# Patient Record
Sex: Female | Born: 1960 | Race: Black or African American | Hispanic: No | Smoking: Current every day smoker
Health system: Southern US, Community
[De-identification: ages and names within clinical notes are randomized; demographics above are authoritative.]

## PROBLEM LIST (undated history)

## (undated) DIAGNOSIS — E785 Hyperlipidemia, unspecified: Secondary | ICD-10-CM

## (undated) DIAGNOSIS — F419 Anxiety disorder, unspecified: Secondary | ICD-10-CM

## (undated) DIAGNOSIS — R252 Cramp and spasm: Secondary | ICD-10-CM

## (undated) DIAGNOSIS — Z720 Tobacco use: Secondary | ICD-10-CM

## (undated) DIAGNOSIS — R0789 Other chest pain: Secondary | ICD-10-CM

## (undated) DIAGNOSIS — N200 Calculus of kidney: Secondary | ICD-10-CM

## (undated) DIAGNOSIS — I10 Essential (primary) hypertension: Secondary | ICD-10-CM

## (undated) HISTORY — DX: Calculus of kidney: N20.0

## (undated) HISTORY — DX: Tobacco use: Z72.0

## (undated) HISTORY — PX: SPINAL FUSION: SHX223

## (undated) HISTORY — PX: VAGINAL HYSTERECTOMY: SUR661

## (undated) HISTORY — DX: Other chest pain: R07.89

## (undated) HISTORY — DX: Hyperlipidemia, unspecified: E78.5

## (undated) HISTORY — PX: BREAST SURGERY: SHX581

## (undated) HISTORY — DX: Essential (primary) hypertension: I10

## (undated) HISTORY — DX: Anxiety disorder, unspecified: F41.9

## (undated) HISTORY — DX: Cramp and spasm: R25.2

## (undated) HISTORY — PX: FOOT SURGERY: SHX648

## (undated) HISTORY — PX: PELVIC LAPAROSCOPY: SHX162

## (undated) HISTORY — PX: BACK SURGERY: SHX140

---

## 1998-09-24 ENCOUNTER — Other Ambulatory Visit: Admission: RE | Admit: 1998-09-24 | Discharge: 1998-09-24 | Payer: Self-pay | Admitting: Gynecology

## 1999-09-29 ENCOUNTER — Other Ambulatory Visit: Admission: RE | Admit: 1999-09-29 | Discharge: 1999-09-29 | Payer: Self-pay | Admitting: Gynecology

## 1999-10-22 ENCOUNTER — Encounter: Payer: Self-pay | Admitting: Family Medicine

## 1999-10-22 ENCOUNTER — Encounter: Admission: RE | Admit: 1999-10-22 | Discharge: 1999-10-22 | Payer: Self-pay | Admitting: Family Medicine

## 1999-10-28 ENCOUNTER — Encounter: Admission: RE | Admit: 1999-10-28 | Discharge: 1999-10-28 | Payer: Self-pay | Admitting: Family Medicine

## 1999-10-28 ENCOUNTER — Encounter: Payer: Self-pay | Admitting: Family Medicine

## 2000-02-15 ENCOUNTER — Encounter: Admission: RE | Admit: 2000-02-15 | Discharge: 2000-02-15 | Payer: Self-pay | Admitting: Family Medicine

## 2000-02-15 ENCOUNTER — Encounter: Payer: Self-pay | Admitting: Family Medicine

## 2000-10-11 ENCOUNTER — Other Ambulatory Visit: Admission: RE | Admit: 2000-10-11 | Discharge: 2000-10-11 | Payer: Self-pay | Admitting: Gynecology

## 2000-10-21 ENCOUNTER — Emergency Department (HOSPITAL_COMMUNITY): Admission: EM | Admit: 2000-10-21 | Discharge: 2000-10-21 | Payer: Self-pay | Admitting: Emergency Medicine

## 2000-10-22 ENCOUNTER — Emergency Department (HOSPITAL_COMMUNITY): Admission: EM | Admit: 2000-10-22 | Discharge: 2000-10-23 | Payer: Self-pay | Admitting: Emergency Medicine

## 2001-03-22 ENCOUNTER — Other Ambulatory Visit: Admission: RE | Admit: 2001-03-22 | Discharge: 2001-03-22 | Payer: Self-pay | Admitting: Gynecology

## 2001-11-01 ENCOUNTER — Other Ambulatory Visit: Admission: RE | Admit: 2001-11-01 | Discharge: 2001-11-01 | Payer: Self-pay | Admitting: Gynecology

## 2002-04-22 ENCOUNTER — Other Ambulatory Visit: Admission: RE | Admit: 2002-04-22 | Discharge: 2002-04-22 | Payer: Self-pay | Admitting: Gynecology

## 2002-07-03 ENCOUNTER — Encounter: Payer: Self-pay | Admitting: Gynecology

## 2002-07-03 ENCOUNTER — Encounter: Admission: RE | Admit: 2002-07-03 | Discharge: 2002-07-03 | Payer: Self-pay | Admitting: Gynecology

## 2002-11-11 ENCOUNTER — Other Ambulatory Visit: Admission: RE | Admit: 2002-11-11 | Discharge: 2002-11-11 | Payer: Self-pay | Admitting: Gynecology

## 2003-01-24 ENCOUNTER — Encounter: Admission: RE | Admit: 2003-01-24 | Discharge: 2003-01-24 | Payer: Self-pay | Admitting: *Deleted

## 2008-07-01 ENCOUNTER — Other Ambulatory Visit: Admission: RE | Admit: 2008-07-01 | Discharge: 2008-07-01 | Payer: Self-pay | Admitting: Gynecology

## 2008-09-22 ENCOUNTER — Ambulatory Visit (HOSPITAL_COMMUNITY): Admission: RE | Admit: 2008-09-22 | Discharge: 2008-09-22 | Payer: Self-pay | Admitting: Gynecology

## 2013-04-01 ENCOUNTER — Other Ambulatory Visit (HOSPITAL_COMMUNITY): Payer: Self-pay | Admitting: Internal Medicine

## 2013-04-01 ENCOUNTER — Ambulatory Visit (HOSPITAL_COMMUNITY)
Admission: RE | Admit: 2013-04-01 | Discharge: 2013-04-01 | Disposition: A | Discharge status: Home / Self Care | Payer: BC Managed Care – PPO | Source: Ambulatory Visit | Attending: Internal Medicine | Admitting: Internal Medicine

## 2013-04-01 DIAGNOSIS — J4489 Other specified chronic obstructive pulmonary disease: Secondary | ICD-10-CM | POA: Insufficient documentation

## 2013-04-01 DIAGNOSIS — R079 Chest pain, unspecified: Secondary | ICD-10-CM | POA: Insufficient documentation

## 2013-04-01 DIAGNOSIS — J449 Chronic obstructive pulmonary disease, unspecified: Secondary | ICD-10-CM | POA: Insufficient documentation

## 2013-04-01 DIAGNOSIS — R52 Pain, unspecified: Secondary | ICD-10-CM

## 2013-04-01 DIAGNOSIS — R002 Palpitations: Secondary | ICD-10-CM | POA: Insufficient documentation

## 2013-04-05 ENCOUNTER — Other Ambulatory Visit (HOSPITAL_COMMUNITY): Payer: Self-pay | Admitting: Internal Medicine

## 2013-04-05 DIAGNOSIS — Z1231 Encounter for screening mammogram for malignant neoplasm of breast: Secondary | ICD-10-CM

## 2013-04-17 ENCOUNTER — Ambulatory Visit (HOSPITAL_COMMUNITY)
Admission: RE | Admit: 2013-04-17 | Discharge: 2013-04-17 | Disposition: A | Discharge status: Home / Self Care | Payer: BC Managed Care – PPO | Source: Ambulatory Visit | Attending: Internal Medicine | Admitting: Internal Medicine

## 2013-04-17 DIAGNOSIS — Z1231 Encounter for screening mammogram for malignant neoplasm of breast: Secondary | ICD-10-CM | POA: Insufficient documentation

## 2013-04-18 ENCOUNTER — Other Ambulatory Visit: Payer: Self-pay | Admitting: Internal Medicine

## 2013-04-18 DIAGNOSIS — R928 Other abnormal and inconclusive findings on diagnostic imaging of breast: Secondary | ICD-10-CM

## 2013-05-02 ENCOUNTER — Ambulatory Visit
Admission: RE | Admit: 2013-05-02 | Discharge: 2013-05-02 | Disposition: A | Discharge status: Home / Self Care | Payer: BC Managed Care – PPO | Source: Ambulatory Visit | Attending: Internal Medicine | Admitting: Internal Medicine

## 2013-05-02 ENCOUNTER — Other Ambulatory Visit: Payer: Self-pay | Admitting: Internal Medicine

## 2013-05-02 DIAGNOSIS — R928 Other abnormal and inconclusive findings on diagnostic imaging of breast: Secondary | ICD-10-CM

## 2013-06-20 ENCOUNTER — Ambulatory Visit (INDEPENDENT_AMBULATORY_CARE_PROVIDER_SITE_OTHER): Payer: BC Managed Care – PPO | Admitting: Gynecology

## 2013-06-20 ENCOUNTER — Other Ambulatory Visit (HOSPITAL_COMMUNITY)
Admission: RE | Admit: 2013-06-20 | Discharge: 2013-06-20 | Disposition: A | Discharge status: Home / Self Care | Payer: BC Managed Care – PPO | Source: Ambulatory Visit | Attending: Gynecology | Admitting: Gynecology

## 2013-06-20 ENCOUNTER — Encounter: Payer: Self-pay | Admitting: Gynecology

## 2013-06-20 VITALS — BP 134/84 | Ht 68.0 in | Wt 158.0 lb

## 2013-06-20 DIAGNOSIS — N951 Menopausal and female climacteric states: Secondary | ICD-10-CM

## 2013-06-20 DIAGNOSIS — Z01419 Encounter for gynecological examination (general) (routine) without abnormal findings: Secondary | ICD-10-CM

## 2013-06-20 NOTE — Progress Notes (Signed)
Donna Simpson Apr 19, 1961 [id number]        52 y.o.  G2P2002 for annual exam. Has not been seen for a number of years. Doing well from a gynecologic standpoint.  Past medical history,surgical history, medications, allergies, family history and social history were all reviewed and documented in the EPIC chart.  ROS:  Performed and pertinent positives and negatives are included in the history, assessment and plan .  Exam: Kim assistant Filed Vitals:   06/20/13 1132  BP: 134/84  Height: 5\' 8"  (1.727 m)  Weight: 158 lb (71.668 kg)   General appearance  Normal Skin grossly normal Head/Neck normal with no cervical or supraclavicular adenopathy thyroid normal Lungs  clear Cardiac RR, without RMG Abdominal  soft, nontender, without masses, organomegaly or hernia Breasts  examined lying and sitting without masses, retractions, discharge or axillary adenopathy. Pelvic  Ext/BUS/vagina  normal   Adnexa  Without masses or tenderness    Anus and perineum  normal   Rectovaginal  normal sphincter tone without palpated masses or tenderness.    Assessment/Plan:  52 y.o. [id number] female for annual exam.   1. Status post vaginal hysterectomy. Having some hot flashes coming and going. We'll check baseline TSH FSH. Not to the extent that she wants to consider treatment such as ERT. Patient wants to monitor at present. 2. Pap smear 2009. Pap of cuff done today. I have no records for prior studies and her outpatient records have been destroyed. No patient provided history of abnormal Pap smears. 3. Mammography 04/2013. Had cyst aspirated at that time. We'll continue with annual mammography. SBE monthly reviewed. 4. Colonoscopy never. Recommended arranging screening colonoscopy and she agrees to do so. 5. Stop smoking strategies reviewed. 6. Health maintenance. Patient reports being followed for hypertension off of medication. Recent check at home 120/80. Blood pressure 134/84 here. She'll continue to  monitor and follow up with her primary physician if needed. No other blood work done as she reports having all done through her primary physician's office. Followup in one year, sooner as needed.  Note: This document was prepared with digital dictation and possible smart phrase technology. Any transcriptional errors that result from this process are unintentional.   Dara Lords MD, 12:03 PM 06/20/2013

## 2013-06-20 NOTE — Patient Instructions (Signed)
Schedule colonoscopy with [facility] gastroenterology at [phone] or [facility] gastroenterology at [phone]

## 2013-06-21 LAB — URINALYSIS W MICROSCOPIC + REFLEX CULTURE
Bacteria, UA: NONE SEEN
Bilirubin Urine: NEGATIVE
Casts: NONE SEEN
Crystals: NONE SEEN
Glucose, UA: NEGATIVE mg/dL
Hgb urine dipstick: NEGATIVE
Ketones, ur: NEGATIVE mg/dL
Leukocytes, UA: NEGATIVE
Nitrite: NEGATIVE
Protein, ur: 30 mg/dL — AB
Specific Gravity, Urine: 1.011 (ref 1.005–1.030)
Squamous Epithelial / LPF: NONE SEEN
Urobilinogen, UA: 0.2 mg/dL (ref 0.0–1.0)
pH: 5.5 (ref 5.0–8.0)

## 2013-06-21 LAB — FOLLICLE STIMULATING HORMONE: FSH: 17.3 m[IU]/mL

## 2013-06-21 LAB — TSH: TSH: 0.737 u[IU]/mL (ref 0.350–4.500)

## 2013-09-05 ENCOUNTER — Other Ambulatory Visit: Payer: Self-pay | Admitting: Orthopaedic Surgery

## 2013-09-05 DIAGNOSIS — M25561 Pain in right knee: Secondary | ICD-10-CM

## 2013-09-05 DIAGNOSIS — M545 Low back pain, unspecified: Secondary | ICD-10-CM

## 2013-09-12 ENCOUNTER — Other Ambulatory Visit: Payer: BC Managed Care – PPO

## 2014-05-27 ENCOUNTER — Other Ambulatory Visit: Payer: Self-pay

## 2014-05-27 DIAGNOSIS — Z1231 Encounter for screening mammogram for malignant neoplasm of breast: Secondary | ICD-10-CM

## 2014-06-03 ENCOUNTER — Ambulatory Visit
Admission: RE | Admit: 2014-06-03 | Discharge: 2014-06-03 | Disposition: A | Discharge status: Home / Self Care | Payer: BC Managed Care – PPO | Source: Ambulatory Visit

## 2014-06-03 ENCOUNTER — Encounter (INDEPENDENT_AMBULATORY_CARE_PROVIDER_SITE_OTHER): Payer: Self-pay

## 2014-06-03 DIAGNOSIS — Z1231 Encounter for screening mammogram for malignant neoplasm of breast: Secondary | ICD-10-CM

## 2014-09-15 ENCOUNTER — Encounter: Payer: Self-pay | Admitting: Gynecology

## 2014-09-25 ENCOUNTER — Encounter: Payer: BC Managed Care – PPO | Admitting: Gynecology

## 2014-10-31 ENCOUNTER — Ambulatory Visit (INDEPENDENT_AMBULATORY_CARE_PROVIDER_SITE_OTHER): Payer: BC Managed Care – PPO | Admitting: Gynecology

## 2014-10-31 ENCOUNTER — Encounter: Payer: Self-pay | Admitting: Gynecology

## 2014-10-31 VITALS — BP 124/76 | Ht 69.0 in | Wt 167.0 lb

## 2014-10-31 DIAGNOSIS — N951 Menopausal and female climacteric states: Secondary | ICD-10-CM

## 2014-10-31 DIAGNOSIS — Z01419 Encounter for gynecological examination (general) (routine) without abnormal findings: Secondary | ICD-10-CM

## 2014-10-31 LAB — CBC WITH DIFFERENTIAL/PLATELET
Basophils Absolute: 0.1 10*3/uL (ref 0.0–0.1)
Basophils Relative: 1 % (ref 0–1)
Eosinophils Absolute: 0.1 10*3/uL (ref 0.0–0.7)
Eosinophils Relative: 2 % (ref 0–5)
HCT: 36 % (ref 36.0–46.0)
Hemoglobin: 11.9 g/dL — ABNORMAL LOW (ref 12.0–15.0)
Lymphocytes Relative: 49 % — ABNORMAL HIGH (ref 12–46)
Lymphs Abs: 3.1 10*3/uL (ref 0.7–4.0)
MCH: 27.3 pg (ref 26.0–34.0)
MCHC: 33.1 g/dL (ref 30.0–36.0)
MCV: 82.6 fL (ref 78.0–100.0)
MPV: 9.7 fL (ref 9.4–12.4)
Monocytes Absolute: 0.3 10*3/uL (ref 0.1–1.0)
Monocytes Relative: 5 % (ref 3–12)
Neutro Abs: 2.7 10*3/uL (ref 1.7–7.7)
Neutrophils Relative %: 43 % (ref 43–77)
Platelets: 276 10*3/uL (ref 150–400)
RBC: 4.36 MIL/uL (ref 3.87–5.11)
RDW: 12.6 % (ref 11.5–15.5)
WBC: 6.3 10*3/uL (ref 4.0–10.5)

## 2014-10-31 NOTE — Progress Notes (Signed)
Donna Simpson [birth year] [id number]        53 y.o.  G2P2002 for annual exam.  Several issues noted below.  Past medical history,surgical history, problem list, medications, allergies, family history and social history were all reviewed and documented as reviewed in the EPIC chart.  ROS:  Performed with pertinent positives and negatives included in the history, assessment and plan.   Additional significant findings :  none   Exam: [facility] personassistant Filed Vitals:   10/31/14 1610  BP: 124/76  Height: 5\' 9"  (1.753 m)  Weight: 167 lb (75.751 kg)   General appearance:  Normal affect, orientation and appearance. Skin: Grossly normal HEENT: Without gross lesions.  No cervical or supraclavicular adenopathy. Thyroid normal.  Lungs:  Clear without wheezing, rales or rhonchi Cardiac: RR, without RMG Abdominal:  Soft, nontender, without masses, guarding, rebound, organomegaly or hernia Breasts:  Examined lying and sitting without masses, retractions, discharge or axillary adenopathy. Pelvic:  Ext/BUS/vagina normal  Adnexa  Without masses or tenderness    Anus and perineum  Normal   Rectovaginal  Normal sphincter tone without palpated masses or tenderness.    Assessment/Plan:  53 y.o. [id number] female for annual exam.   1. Status post TVH. Is having hot flashes and night sweats. Has tried OTC products to include Estrovin with good relief. Check baseline TSH FSH. Discussed possible ERT. Risks benefits to include the WHI study with increased risk of stroke heart attack DVT breast cancer reviewed. At this point patient is not interested in trying but wants to use the OTC product. Will call me if they worsen and was to consider ERT. 2. Pap smear 2014. No Pap smear done today. No history of significant abnormal Pap smears. 3. Mammography 05/2014. Continue with annual mammography. SBE monthly reviewed. 4. Colonoscopy 2015. Repeat at their recommended interval. 5. Health maintenance. Screening baseline CBC  comprehensive metabolic panel lipid profile urinalysis TSH vitamin D FSH ordered. Follow up for lab results otherwise annually. Sooner if menopausal symptoms worsen.     Dara LordsFONTAINE,[name] P MD, 4:38 PM 10/31/2014

## 2014-10-31 NOTE — Patient Instructions (Signed)
You may obtain a copy of any labs that were done today by logging onto MyChart as outlined in the instructions provided with your AVS (after visit summary). The office will not call with normal lab results but certainly if there are any significant abnormalities then we will contact you.   Health Maintenance, Female A healthy lifestyle and preventative care can promote health and wellness.  Maintain regular health, dental, and eye exams.  Eat a healthy diet. Foods like vegetables, fruits, whole grains, low-fat dairy products, and lean protein foods contain the nutrients you need without too many calories. Decrease your intake of foods high in solid fats, added sugars, and salt. Get information about a proper diet from your caregiver, if necessary.  Regular physical exercise is one of the most important things you can do for your health. Most adults should get at least 150 minutes of moderate-intensity exercise (any activity that increases your heart rate and causes you to sweat) each week. In addition, most adults need muscle-strengthening exercises on 2 or more days a week.   Maintain a healthy weight. The body mass index (BMI) is a screening tool to identify possible weight problems. It provides an estimate of body fat based on height and weight. Your caregiver can help determine your BMI, and can help you achieve or maintain a healthy weight. For adults 20 years and older:  A BMI below 18.5 is considered underweight.  A BMI of 18.5 to 24.9 is normal.  A BMI of 25 to 29.9 is considered overweight.  A BMI of 30 and above is considered obese.  Maintain normal blood lipids and cholesterol by exercising and minimizing your intake of saturated fat. Eat a balanced diet with plenty of fruits and vegetables. Blood tests for lipids and cholesterol should begin at age 61 and be repeated every 5 years. If your lipid or cholesterol levels are high, you are over 50, or you are a high risk for heart  disease, you may need your cholesterol levels checked more frequently.Ongoing high lipid and cholesterol levels should be treated with medicines if diet and exercise are not effective.  If you smoke, find out from your caregiver how to quit. If you do not use tobacco, do not start.  Lung cancer screening is recommended for adults aged 33 80 years who are at high risk for developing lung cancer because of a history of smoking. Yearly low-dose computed tomography (CT) is recommended for people who have at least a 30-pack-year history of smoking and are a current smoker or have quit within the past 15 years. A pack year of smoking is smoking an average of 1 pack of cigarettes a day for 1 year (for example: 1 pack a day for 30 years or 2 packs a day for 15 years). Yearly screening should continue until the smoker has stopped smoking for at least 15 years. Yearly screening should also be stopped for people who develop a health problem that would prevent them from having lung cancer treatment.  If you are pregnant, do not drink alcohol. If you are breastfeeding, be very cautious about drinking alcohol. If you are not pregnant and choose to drink alcohol, do not exceed 1 drink per day. One drink is considered to be 12 ounces (355 mL) of beer, 5 ounces (148 mL) of wine, or 1.5 ounces (44 mL) of liquor.  Avoid use of street drugs. Do not share needles with anyone. Ask for help if you need support or instructions about stopping  the use of drugs.  High blood pressure causes heart disease and increases the risk of stroke. Blood pressure should be checked at least every 1 to 2 years. Ongoing high blood pressure should be treated with medicines, if weight loss and exercise are not effective.  If you are 59 to 52 years old, ask your caregiver if you should take aspirin to prevent strokes.  Diabetes screening involves taking a blood sample to check your fasting blood sugar level. This should be done once every 3  years, after age 91, if you are within normal weight and without risk factors for diabetes. Testing should be considered at a younger age or be carried out more frequently if you are overweight and have at least 1 risk factor for diabetes.  Breast cancer screening is essential preventative care for women. You should practice "breast self-awareness." This means understanding the normal appearance and feel of your breasts and may include breast self-examination. Any changes detected, no matter how small, should be reported to a caregiver. Women in their 66s and 30s should have a clinical breast exam (CBE) by a caregiver as part of a regular health exam every 1 to 3 years. After age 101, women should have a CBE every year. Starting at age 100, women should consider having a mammogram (breast X-ray) every year. Women who have a family history of breast cancer should talk to their caregiver about genetic screening. Women at a high risk of breast cancer should talk to their caregiver about having an MRI and a mammogram every year.  Breast cancer gene (BRCA)-related cancer risk assessment is recommended for women who have family members with BRCA-related cancers. BRCA-related cancers include breast, ovarian, tubal, and peritoneal cancers. Having family members with these cancers may be associated with an increased risk for harmful changes (mutations) in the breast cancer genes BRCA1 and BRCA2. Results of the assessment will determine the need for genetic counseling and BRCA1 and BRCA2 testing.  The Pap test is a screening test for cervical cancer. Women should have a Pap test starting at age 57. Between ages 25 and 35, Pap tests should be repeated every 2 years. Beginning at age 37, you should have a Pap test every 3 years as long as the past 3 Pap tests have been normal. If you had a hysterectomy for a problem that was not cancer or a condition that could lead to cancer, then you no longer need Pap tests. If you are  between ages 50 and 76, and you have had normal Pap tests going back 10 years, you no longer need Pap tests. If you have had past treatment for cervical cancer or a condition that could lead to cancer, you need Pap tests and screening for cancer for at least 20 years after your treatment. If Pap tests have been discontinued, risk factors (such as a new sexual partner) need to be reassessed to determine if screening should be resumed. Some women have medical problems that increase the chance of getting cervical cancer. In these cases, your caregiver may recommend more frequent screening and Pap tests.  The human papillomavirus (HPV) test is an additional test that may be used for cervical cancer screening. The HPV test looks for the virus that can cause the cell changes on the cervix. The cells collected during the Pap test can be tested for HPV. The HPV test could be used to screen women aged 44 years and older, and should be used in women of any age  who have unclear Pap test results. After the age of 55, women should have HPV testing at the same frequency as a Pap test.  Colorectal cancer can be detected and often prevented. Most routine colorectal cancer screening begins at the age of 44 and continues through age 20. However, your caregiver may recommend screening at an earlier age if you have risk factors for colon cancer. On a yearly basis, your caregiver may provide home test kits to check for hidden blood in the stool. Use of a small camera at the end of a tube, to directly examine the colon (sigmoidoscopy or colonoscopy), can detect the earliest forms of colorectal cancer. Talk to your caregiver about this at age 86, when routine screening begins. Direct examination of the colon should be repeated every 5 to 10 years through age 13, unless early forms of pre-cancerous polyps or small growths are found.  Hepatitis C blood testing is recommended for all people born from [birth year] through 1964 and any  individual with known risks for hepatitis C.  Practice safe sex. Use condoms and avoid high-risk sexual practices to reduce the spread of sexually transmitted infections (STIs). Sexually active women aged 36 and younger should be checked for Chlamydia, which is a common sexually transmitted infection. Older women with new or multiple partners should also be tested for Chlamydia. Testing for other STIs is recommended if you are sexually active and at increased risk.  Osteoporosis is a disease in which the bones lose minerals and strength with aging. This can result in serious bone fractures. The risk of osteoporosis can be identified using a bone density scan. Women ages 20 and over and women at risk for fractures or osteoporosis should discuss screening with their caregivers. Ask your caregiver whether you should be taking a calcium supplement or vitamin D to reduce the rate of osteoporosis.  Menopause can be associated with physical symptoms and risks. Hormone replacement therapy is available to decrease symptoms and risks. You should talk to your caregiver about whether hormone replacement therapy is right for you.  Use sunscreen. Apply sunscreen liberally and repeatedly throughout the day. You should seek shade when your shadow is shorter than you. Protect yourself by wearing long sleeves, pants, a wide-brimmed hat, and sunglasses year round, whenever you are outdoors.  Notify your caregiver of new moles or changes in moles, especially if there is a change in shape or color. Also notify your caregiver if a mole is larger than the size of a pencil eraser.  Stay current with your immunizations. Document Released: 05/16/2011 Document Revised: 02/25/2013 Document Reviewed: 05/16/2011 [facility] Patient Information 2014 [employer].

## 2014-11-01 LAB — LIPID PANEL
Cholesterol: 159 mg/dL (ref 0–200)
HDL: 54 mg/dL (ref >39)
LDL Cholesterol: 90 mg/dL (ref 0–99)
Total CHOL/HDL Ratio: 2.9 Ratio
Triglycerides: 77 mg/dL (ref <150)
VLDL: 15 mg/dL (ref 0–40)

## 2014-11-01 LAB — COMPREHENSIVE METABOLIC PANEL
ALT: 12 U/L (ref 0–35)
AST: 17 U/L (ref 0–37)
Albumin: 4.1 g/dL (ref 3.5–5.2)
Alkaline Phosphatase: 59 U/L (ref 39–117)
BUN: 15 mg/dL (ref 6–23)
CO2: 26 mEq/L (ref 19–32)
Calcium: 9.6 mg/dL (ref 8.4–10.5)
Chloride: 99 mEq/L (ref 96–112)
Creat: 0.85 mg/dL (ref 0.50–1.10)
Glucose, Bld: 82 mg/dL (ref 70–99)
Potassium: 3.4 mEq/L — ABNORMAL LOW (ref 3.5–5.3)
Sodium: 137 mEq/L (ref 135–145)
Total Bilirubin: 0.4 mg/dL (ref 0.2–1.2)
Total Protein: 6.8 g/dL (ref 6.0–8.3)

## 2014-11-01 LAB — URINALYSIS W MICROSCOPIC + REFLEX CULTURE
Bacteria, UA: NONE SEEN
Bilirubin Urine: NEGATIVE
Casts: NONE SEEN
Crystals: NONE SEEN
Glucose, UA: NEGATIVE mg/dL
Hgb urine dipstick: NEGATIVE
Ketones, ur: NEGATIVE mg/dL
Leukocytes, UA: NEGATIVE
Nitrite: NEGATIVE
Protein, ur: 30 mg/dL — AB
Specific Gravity, Urine: 1.025 (ref 1.005–1.030)
Urobilinogen, UA: 0.2 mg/dL (ref 0.0–1.0)
pH: 5 (ref 5.0–8.0)

## 2014-11-01 LAB — VITAMIN D 25 HYDROXY (VIT D DEFICIENCY, FRACTURES): Vit D, 25-Hydroxy: 20 ng/mL — ABNORMAL LOW (ref 30–100)

## 2014-11-01 LAB — FOLLICLE STIMULATING HORMONE: FSH: 40.1 m[IU]/mL

## 2014-11-01 LAB — TSH: TSH: 1.285 u[IU]/mL (ref 0.350–4.500)

## 2014-11-04 ENCOUNTER — Other Ambulatory Visit: Payer: Self-pay | Admitting: Gynecology

## 2014-11-04 DIAGNOSIS — E876 Hypokalemia: Secondary | ICD-10-CM

## 2014-11-04 DIAGNOSIS — E559 Vitamin D deficiency, unspecified: Secondary | ICD-10-CM

## 2015-01-26 DIAGNOSIS — Z0289 Encounter for other administrative examinations: Secondary | ICD-10-CM

## 2015-03-05 ENCOUNTER — Other Ambulatory Visit: Payer: 59

## 2015-03-05 DIAGNOSIS — E559 Vitamin D deficiency, unspecified: Secondary | ICD-10-CM

## 2015-03-05 DIAGNOSIS — E876 Hypokalemia: Secondary | ICD-10-CM

## 2015-03-06 LAB — POTASSIUM: Potassium: 4.1 mEq/L (ref 3.5–5.3)

## 2015-03-08 LAB — VITAMIN D 1,25 DIHYDROXY
Vitamin D 1, 25 (OH)2 Total: 53 pg/mL (ref 18–72)
Vitamin D2 1, 25 (OH)2: <8 pg/mL
Vitamin D3 1, 25 (OH)2: 53 pg/mL

## 2015-05-19 ENCOUNTER — Other Ambulatory Visit: Payer: Self-pay

## 2015-05-19 DIAGNOSIS — Z1231 Encounter for screening mammogram for malignant neoplasm of breast: Secondary | ICD-10-CM

## 2015-06-09 ENCOUNTER — Ambulatory Visit
Admission: RE | Admit: 2015-06-09 | Discharge: 2015-06-09 | Disposition: A | Discharge status: Home / Self Care | Payer: 59 | Source: Ambulatory Visit

## 2015-06-09 DIAGNOSIS — Z1231 Encounter for screening mammogram for malignant neoplasm of breast: Secondary | ICD-10-CM

## 2015-08-21 ENCOUNTER — Emergency Department (HOSPITAL_COMMUNITY)
Admission: EM | Admit: 2015-08-21 | Discharge: 2015-08-21 | Disposition: A | Discharge status: Home / Self Care | Payer: 59 | Attending: Emergency Medicine | Admitting: Emergency Medicine

## 2015-08-21 ENCOUNTER — Emergency Department (HOSPITAL_COMMUNITY): Payer: 59

## 2015-08-21 ENCOUNTER — Encounter (HOSPITAL_COMMUNITY): Payer: Self-pay | Admitting: Emergency Medicine

## 2015-08-21 DIAGNOSIS — Z720 Tobacco use: Secondary | ICD-10-CM | POA: Diagnosis not present

## 2015-08-21 DIAGNOSIS — S3992XA Unspecified injury of lower back, initial encounter: Secondary | ICD-10-CM | POA: Insufficient documentation

## 2015-08-21 DIAGNOSIS — Y9389 Activity, other specified: Secondary | ICD-10-CM | POA: Insufficient documentation

## 2015-08-21 DIAGNOSIS — T148XXA Other injury of unspecified body region, initial encounter: Secondary | ICD-10-CM

## 2015-08-21 DIAGNOSIS — S199XXA Unspecified injury of neck, initial encounter: Secondary | ICD-10-CM | POA: Diagnosis not present

## 2015-08-21 DIAGNOSIS — F419 Anxiety disorder, unspecified: Secondary | ICD-10-CM | POA: Diagnosis not present

## 2015-08-21 DIAGNOSIS — Y998 Other external cause status: Secondary | ICD-10-CM | POA: Insufficient documentation

## 2015-08-21 DIAGNOSIS — Z91040 Latex allergy status: Secondary | ICD-10-CM | POA: Insufficient documentation

## 2015-08-21 DIAGNOSIS — S0990XA Unspecified injury of head, initial encounter: Secondary | ICD-10-CM | POA: Diagnosis present

## 2015-08-21 DIAGNOSIS — T148 Other injury of unspecified body region: Secondary | ICD-10-CM | POA: Insufficient documentation

## 2015-08-21 DIAGNOSIS — Y92410 Unspecified street and highway as the place of occurrence of the external cause: Secondary | ICD-10-CM | POA: Diagnosis not present

## 2015-08-21 DIAGNOSIS — Z79899 Other long term (current) drug therapy: Secondary | ICD-10-CM | POA: Insufficient documentation

## 2015-08-21 MED ORDER — CYCLOBENZAPRINE HCL 10 MG PO TABS: 10.0000 mg | ORAL_TABLET | Freq: Two times a day (BID) | ORAL | Status: DC | PRN

## 2015-08-21 MED ORDER — IBUPROFEN 800 MG PO TABS: 800.0000 mg | ORAL_TABLET | Freq: Three times a day (TID) | ORAL | Status: DC
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
## 2015-12-03 ENCOUNTER — Ambulatory Visit (INDEPENDENT_AMBULATORY_CARE_PROVIDER_SITE_OTHER): Payer: Medicaid Other | Admitting: Gynecology

## 2015-12-03 ENCOUNTER — Encounter: Payer: Self-pay | Admitting: Gynecology

## 2015-12-03 VITALS — BP 118/76 | Ht 69.0 in | Wt 176.0 lb

## 2015-12-03 DIAGNOSIS — Z1329 Encounter for screening for other suspected endocrine disorder: Secondary | ICD-10-CM

## 2015-12-03 DIAGNOSIS — Z13220 Encounter for screening for lipoid disorders: Secondary | ICD-10-CM | POA: Diagnosis not present

## 2015-12-03 DIAGNOSIS — Z0000 Encounter for general adult medical examination without abnormal findings: Secondary | ICD-10-CM

## 2015-12-03 DIAGNOSIS — Z1321 Encounter for screening for nutritional disorder: Secondary | ICD-10-CM

## 2015-12-03 DIAGNOSIS — Z01419 Encounter for gynecological examination (general) (routine) without abnormal findings: Secondary | ICD-10-CM | POA: Diagnosis not present

## 2015-12-03 LAB — LIPID PANEL
Cholesterol: 159 mg/dL (ref 125–200)
HDL: 63 mg/dL (ref >=46)
LDL Cholesterol: 75 mg/dL (ref <130)
Total CHOL/HDL Ratio: 2.5 Ratio (ref <=5.0)
Triglycerides: 104 mg/dL (ref <150)
VLDL: 21 mg/dL (ref <30)

## 2015-12-03 LAB — CBC WITH DIFFERENTIAL/PLATELET
Basophils Absolute: 0 10*3/uL (ref 0.0–0.1)
Basophils Relative: 0 % (ref 0–1)
Eosinophils Absolute: 0.1 10*3/uL (ref 0.0–0.7)
Eosinophils Relative: 2 % (ref 0–5)
HCT: 38.4 % (ref 36.0–46.0)
Hemoglobin: 12.1 g/dL (ref 12.0–15.0)
Lymphocytes Relative: 47 % — ABNORMAL HIGH (ref 12–46)
Lymphs Abs: 3.3 10*3/uL (ref 0.7–4.0)
MCH: 26.9 pg (ref 26.0–34.0)
MCHC: 31.5 g/dL (ref 30.0–36.0)
MCV: 85.5 fL (ref 78.0–100.0)
MPV: 10 fL (ref 8.6–12.4)
Monocytes Absolute: 0.5 10*3/uL (ref 0.1–1.0)
Monocytes Relative: 7 % (ref 3–12)
Neutro Abs: 3.1 10*3/uL (ref 1.7–7.7)
Neutrophils Relative %: 44 % (ref 43–77)
Platelets: 259 10*3/uL (ref 150–400)
RBC: 4.49 MIL/uL (ref 3.87–5.11)
RDW: 12.4 % (ref 11.5–15.5)
WBC: 7 10*3/uL (ref 4.0–10.5)

## 2015-12-03 LAB — COMPREHENSIVE METABOLIC PANEL
ALT: 23 U/L (ref 6–29)
AST: 26 U/L (ref 10–35)
Albumin: 4.1 g/dL (ref 3.6–5.1)
Alkaline Phosphatase: 62 U/L (ref 33–130)
BUN: 16 mg/dL (ref 7–25)
CO2: 27 mmol/L (ref 20–31)
Calcium: 9.7 mg/dL (ref 8.6–10.4)
Chloride: 97 mmol/L — ABNORMAL LOW (ref 98–110)
Creat: 0.69 mg/dL (ref 0.50–1.05)
Glucose, Bld: 84 mg/dL (ref 65–99)
Potassium: 3.8 mmol/L (ref 3.5–5.3)
Sodium: 136 mmol/L (ref 135–146)
Total Bilirubin: 0.4 mg/dL (ref 0.2–1.2)
Total Protein: 6.9 g/dL (ref 6.1–8.1)

## 2015-12-03 NOTE — Patient Instructions (Signed)

## 2015-12-03 NOTE — Progress Notes (Signed)
Donna Simpson 11/29/1960 [id number]        55 y.o.  G2P2002  for annual exam.  Doing well.  Past medical history,surgical history, problem list, medications, allergies, family history and social history were all reviewed and documented as reviewed in the EPIC chart.  ROS:  Performed with pertinent positives and negatives included in the history, assessment and plan.   Additional significant findings :  none   Exam: Kennon Portela assistant Filed Vitals:   12/03/15 1501  BP: 118/76  Height:  (1.753 m)  Weight: 176 lb (79.833 kg)   General appearance:  Normal affect, orientation and appearance. Skin: Grossly normal HEENT: Without gross lesions.  No cervical or supraclavicular adenopathy. Thyroid normal.  Lungs:  Clear without wheezing, rales or rhonchi Cardiac: RR, without RMG Abdominal:  Soft, nontender, without masses, guarding, rebound, organomegaly or hernia Breasts:  Examined lying and sitting without masses, retractions, discharge or axillary adenopathy. Pelvic:  Ext/BUS/vagina normal  Adnexa  Without masses or tenderness    Anus and perineum  Normal   Rectovaginal  Normal sphincter tone without palpated masses or tenderness.    Assessment/Plan:  55 y.o. G75P2002 female for annual exam.   1. Postmenopausal. Status post TVH in the past. Having some hot flashes and sweats but overall doing well off ERT. No vaginal dryness. Continue monitor report any issues. 2. Mammography 05/2015. Continue with annual mammography when due. SBE monthly reviewed. 3. Pap smear 2014. No Pap smear done today. No history of abnormal Pap smears. Status post hysterectomy for benign indications. Options to stop screening altogether or less for screening intervals reviewed. Will readdress on an annual basis. 4. Colonoscopy 2015. Repeat at their recommended interval. 5. DEXA never. Plan further into the menopause. Increased calcium vitamin D reviewed. Check vitamin D level today. 6. Health maintenance.  Baseline CBC, comprehensive metabolic panel, lipid profile, TSH, vitamin D, urinalysis ordered follow up in one year, sooner as needed.   Dara Lords MD, 3:27 PM 12/03/2015

## 2015-12-04 ENCOUNTER — Other Ambulatory Visit: Payer: Self-pay | Admitting: Gynecology

## 2015-12-04 DIAGNOSIS — E559 Vitamin D deficiency, unspecified: Secondary | ICD-10-CM

## 2015-12-04 LAB — URINALYSIS W MICROSCOPIC + REFLEX CULTURE
Bacteria, UA: NONE SEEN [HPF]
Bilirubin Urine: NEGATIVE
Casts: NONE SEEN [LPF]
Crystals: NONE SEEN [HPF]
Glucose, UA: NEGATIVE
Hgb urine dipstick: NEGATIVE
Ketones, ur: NEGATIVE
Leukocytes, UA: NEGATIVE
Nitrite: NEGATIVE
Protein, ur: NEGATIVE
RBC / HPF: NONE SEEN RBC/HPF (ref <=2)
Specific Gravity, Urine: 1.01 (ref 1.001–1.035)
Squamous Epithelial / LPF: NONE SEEN [HPF] (ref <=5)
WBC, UA: NONE SEEN WBC/HPF (ref <=5)
Yeast: NONE SEEN [HPF]
pH: 6 (ref 5.0–8.0)

## 2015-12-04 LAB — TSH: TSH: 0.633 u[IU]/mL (ref 0.350–4.500)

## 2015-12-04 LAB — VITAMIN D 25 HYDROXY (VIT D DEFICIENCY, FRACTURES): Vit D, 25-Hydroxy: 22 ng/mL — ABNORMAL LOW (ref 30–100)

## 2016-05-31 ENCOUNTER — Other Ambulatory Visit: Payer: Self-pay | Admitting: Gynecology

## 2016-05-31 DIAGNOSIS — Z1231 Encounter for screening mammogram for malignant neoplasm of breast: Secondary | ICD-10-CM

## 2016-06-08 ENCOUNTER — Ambulatory Visit: Payer: Medicaid Other

## 2016-06-08 ENCOUNTER — Encounter (HOSPITAL_COMMUNITY): Payer: Self-pay | Admitting: *Deleted

## 2016-06-08 ENCOUNTER — Ambulatory Visit (HOSPITAL_COMMUNITY)
Admission: EM | Admit: 2016-06-08 | Discharge: 2016-06-08 | Disposition: A | Discharge status: Home / Self Care | Payer: Medicaid Other | Attending: Physician Assistant | Admitting: Physician Assistant

## 2016-06-08 DIAGNOSIS — H109 Unspecified conjunctivitis: Secondary | ICD-10-CM

## 2016-06-08 HISTORY — DX: Essential (primary) hypertension: I10

## 2016-06-08 MED ORDER — GENTAMICIN SULFATE 0.3 % OP SOLN: 1.0000 [drp] | OPHTHALMIC | 0 refills | Status: AC

## 2016-06-08 NOTE — ED Provider Notes (Signed)
CSN: [id number]     Arrival date & time 06/08/16  1025 History   First MD Initiated Contact with Patient 06/08/16 1116     Chief Complaint  Patient presents with  . Eye Drainage   (Consider location/radiation/quality/duration/timing/severity/associated sxs/prior Treatment) HPI History obtained from patient:  Pt presents with the cc of:  Left eye irritation Duration of symptoms: 2 days Treatment prior to arrival: Over-the-counter meds, TEA bags Context: Onset of red itchy eye with matting of the lites eyelashes after returning from [place]. Patient was at the [employer]. Other symptoms include: None Pain score: 1 FAMILY HISTORY: Heart disease    Past Medical History:  Diagnosis Date  . Anxiety   . Anxiety   . Hypertension    Past Surgical History:  Procedure Laterality Date  . BACK SURGERY     Spinal fusion  . BREAST SURGERY     Biopsy--benign  . FOOT SURGERY    . PELVIC LAPAROSCOPY    . SPINAL FUSION    . VAGINAL HYSTERECTOMY     Family History  Problem Relation Age of Onset  . Cancer Mother     Lung  . Hypertension Father   . Heart attack Father   . Hypertension Sister   . Hypertension Brother   . Diabetes Brother    Social History  Substance Use Topics  . Smoking status: Current Every Day Smoker    Packs/day: 0.50    Types: Cigarettes  . Smokeless tobacco: Never Used  . Alcohol use 0.0 oz/week     Comment: Rare   OB History    Gravida Para Term Preterm AB Living   SAB TAB Ectopic Multiple Live Births                 Review of Systems  Denies: HEADACHE, NAUSEA, ABDOMINAL PAIN, CHEST PAIN, CONGESTION, DYSURIA, SHORTNESS OF BREATH  Allergies  Hydrocodone; Latex; and Zithromax [azithromycin]  Home Medications   Prior to Admission medications   Medication Sig Start Date End Date Taking? Authorizing Provider  clonazePAM (KLONOPIN) 1 MG tablet Take 1 mg by mouth 2 (two) times daily.   Yes Historical Provider, MD   hydrochlorothiazide (MICROZIDE) 12.5 MG capsule Take 12.5 mg by mouth daily.   Yes Historical Provider, MD  Calcium Carbonate-Vitamin D (CALCIUM + D PO) Take by mouth.    Historical Provider, MD  DULoxetine HCl (CYMBALTA PO) Take by mouth.    Historical Provider, MD  gentamicin (GARAMYCIN) 0.3 % ophthalmic solution Place 1 drop into the left eye every 4 (four) hours. 06/08/16 06/13/16  Tharon Aquas, PA  ibuprofen (ADVIL,MOTRIN) 800 MG tablet Take 1 tablet (800 mg total) by mouth 3 (three) times daily. 08/21/15   Elpidio Anis, PA-C  traMADol (ULTRAM) 50 MG tablet Take by mouth every 6 (six) hours as needed.    Historical Provider, MD   Meds Ordered and Administered this Visit  Medications - No data to display  BP 140/85 (BP Location: Left Arm)   Pulse 78   Temp 98.5 F (36.9 C) (Oral)   Resp 16   SpO2 98%  No data found.   Physical Exam NURSES NOTES AND VITAL SIGNS REVIEWED. CONSTITUTIONAL: Well developed, well nourished, no acute distress HEENT: normocephalic, atraumatic EYES: Left conjunctiva injected with matting of the eyelashes. NECK:normal ROM, supple, no adenopathy PULMONARY:No respiratory distress, normal effort ABDOMINAL: Soft, ND, NT BS+, No CVAT MUSCULOSKELETAL: Normal ROM of all extremities,  SKIN: warm and dry without rash PSYCHIATRIC: Mood and affect, behavior are normal  Urgent Care Course   Clinical Course    Procedures (including critical care time)  Labs Review Labs Reviewed - No data to display  Imaging Review No results found.   Visual Acuity Review  Right Eye Distance:   Left Eye Distance:   Bilateral Distance:    Right Eye Near:   Left Eye Near:    Bilateral Near:        Gentamicin ophthalmic drops MDM   1. Conjunctivitis of left eye     Patient is reassured that there are no issues that require transfer to higher level of care at this time or additional tests. Patient is advised to continue home symptomatic treatment. Patient  is advised that if there are new or worsening symptoms to attend the emergency department, contact primary care provider, or return to UC. Instructions of care provided discharged home in stable condition.    THIS NOTE WAS GENERATED USING A VOICE RECOGNITION SOFTWARE PROGRAM. ALL REASONABLE EFFORTS  WERE MADE TO PROOFREAD THIS DOCUMENT FOR ACCURACY.  I have verbally reviewed the discharge instructions with the patient. A printed AVS was given to the patient.  All questions were answered prior to discharge.      Tharon Aquas, PA 06/08/16 1133

## 2016-06-21 ENCOUNTER — Ambulatory Visit
Admission: RE | Admit: 2016-06-21 | Discharge: 2016-06-21 | Disposition: A | Discharge status: Home / Self Care | Payer: Medicaid Other | Source: Ambulatory Visit | Attending: Gynecology | Admitting: Gynecology

## 2016-06-21 DIAGNOSIS — Z1231 Encounter for screening mammogram for malignant neoplasm of breast: Secondary | ICD-10-CM

## 2016-06-22 ENCOUNTER — Other Ambulatory Visit: Payer: Self-pay | Admitting: Gynecology

## 2016-06-22 DIAGNOSIS — R928 Other abnormal and inconclusive findings on diagnostic imaging of breast: Secondary | ICD-10-CM

## 2016-06-29 ENCOUNTER — Other Ambulatory Visit: Payer: Medicaid Other

## 2016-06-29 ENCOUNTER — Ambulatory Visit
Admission: RE | Admit: 2016-06-29 | Discharge: 2016-06-29 | Disposition: A | Discharge status: Home / Self Care | Payer: Medicaid Other | Source: Ambulatory Visit | Attending: Gynecology | Admitting: Gynecology

## 2016-06-29 DIAGNOSIS — R928 Other abnormal and inconclusive findings on diagnostic imaging of breast: Secondary | ICD-10-CM

## 2016-12-06 ENCOUNTER — Encounter: Payer: Self-pay | Admitting: Gynecology

## 2016-12-06 ENCOUNTER — Ambulatory Visit (INDEPENDENT_AMBULATORY_CARE_PROVIDER_SITE_OTHER): Payer: Medicaid Other | Admitting: Gynecology

## 2016-12-06 VITALS — BP 122/76 | Ht 68.0 in | Wt 160.0 lb

## 2016-12-06 DIAGNOSIS — Z0000 Encounter for general adult medical examination without abnormal findings: Secondary | ICD-10-CM

## 2016-12-06 DIAGNOSIS — Z01419 Encounter for gynecological examination (general) (routine) without abnormal findings: Secondary | ICD-10-CM | POA: Diagnosis not present

## 2016-12-06 DIAGNOSIS — R252 Cramp and spasm: Secondary | ICD-10-CM

## 2016-12-06 LAB — COMPREHENSIVE METABOLIC PANEL
ALT: 16 U/L (ref 6–29)
AST: 19 U/L (ref 10–35)
Albumin: 3.9 g/dL (ref 3.6–5.1)
Alkaline Phosphatase: 91 U/L (ref 33–130)
BUN: 18 mg/dL (ref 7–25)
CO2: 26 mmol/L (ref 20–31)
Calcium: 9.5 mg/dL (ref 8.6–10.4)
Chloride: 103 mmol/L (ref 98–110)
Creat: 0.77 mg/dL (ref 0.50–1.05)
Glucose, Bld: 86 mg/dL (ref 65–99)
Potassium: 3.9 mmol/L (ref 3.5–5.3)
Sodium: 136 mmol/L (ref 135–146)
Total Bilirubin: 0.4 mg/dL (ref 0.2–1.2)
Total Protein: 6.9 g/dL (ref 6.1–8.1)

## 2016-12-06 NOTE — Addendum Note (Signed)
Addended by: Dayna BarkerGARDNER, KIMBERLY K on: 12/06/2016 03:49 PM   Modules accepted: Orders

## 2016-12-06 NOTE — Patient Instructions (Signed)
Follow up with your primary physician if you continue to have muscle cramps.

## 2016-12-06 NOTE — Progress Notes (Signed)
    Donna Simpson 11-14-1961 [id number]        56 y.o.  G2P2002 for annual exam.  Complaining of bilateral leg cramps. Comes and goes sometimes making walking difficult. Is on diuretic with potassium supplement although has not had her electrolytes checked recently.  Past medical history,surgical history, problem list, medications, allergies, family history and social history were all reviewed and documented as reviewed in the EPIC chart.  ROS:  Performed with pertinent positives and negatives included in the history, assessment and plan.   Additional significant findings :  None   Exam: Kennon PortelaKim Gardner assistant Vitals:   12/06/16 1508  BP: 122/76  Weight: 160 lb (72.6 kg)  Height: 5\' 8"  (1.727 m)   Body mass index is 24.33 kg/m.  General appearance:  Normal affect, orientation and appearance. Skin: Grossly normal HEENT: Without gross lesions.  No cervical or supraclavicular adenopathy. Thyroid normal.  Lungs:  Clear without wheezing, rales or rhonchi Cardiac: RR, without RMG Abdominal:  Soft, nontender, without masses, guarding, rebound, organomegaly or hernia Breasts:  Examined lying and sitting without masses, retractions, discharge or axillary adenopathy. Pelvic:  Ext, BUS, Vagina normal. Pap smear of cuff done  Adnexa without masses or tenderness    Anus and perineum normal   Rectovaginal normal sphincter tone without palpated masses or tenderness.    Assessment/Plan:  56 y.o. 502P2002 female for annual exam, status post TVH in the past..   1. Muscle cramps. Exam without findings. Will check baseline comprehensive metabolic panel. Recommended she follow up with her primary physician who manages her hypertension if they continue. 2. Mammography 06/2016. Continue with annual mammography when due. SBE monthly reviewed. 3. Pap smear 2014. Pap smear of vaginal cuff done today. Options to stop screening altogether based on current screening guidelines, history of hysterectomy and  no history of significant abnormal Pap smears reviewed. Will readdress on annual basis. 4. Colonoscopy 2015. Repeat at their recommended interval. 5. DEXA never. Will plan further into the menopause. 6. Health maintenance. No routine lab work done as she will arrange for this through her primary physician's office. Follow up 1 year, sooner as needed.   Dara LordsFONTAINE,Dontez Hauss P MD, 3:35 PM 12/06/2016

## 2016-12-07 LAB — PAP IG W/ RFLX HPV ASCU

## 2016-12-16 ENCOUNTER — Other Ambulatory Visit: Payer: Self-pay | Admitting: Orthopaedic Surgery

## 2016-12-16 DIAGNOSIS — M810 Age-related osteoporosis without current pathological fracture: Secondary | ICD-10-CM

## 2016-12-20 ENCOUNTER — Ambulatory Visit
Admission: RE | Admit: 2016-12-20 | Discharge: 2016-12-20 | Disposition: A | Discharge status: Home / Self Care | Payer: Medicaid Other | Source: Ambulatory Visit | Attending: Orthopaedic Surgery | Admitting: Orthopaedic Surgery

## 2016-12-20 DIAGNOSIS — M810 Age-related osteoporosis without current pathological fracture: Secondary | ICD-10-CM

## 2016-12-28 ENCOUNTER — Encounter (HOSPITAL_COMMUNITY): Payer: Self-pay | Admitting: Emergency Medicine

## 2016-12-28 ENCOUNTER — Ambulatory Visit (HOSPITAL_COMMUNITY)
Admission: EM | Admit: 2016-12-28 | Discharge: 2016-12-28 | Disposition: A | Discharge status: Home / Self Care | Payer: Medicaid Other | Attending: Family Medicine | Admitting: Family Medicine

## 2016-12-28 DIAGNOSIS — H5711 Ocular pain, right eye: Secondary | ICD-10-CM

## 2016-12-28 DIAGNOSIS — S0501XA Injury of conjunctiva and corneal abrasion without foreign body, right eye, initial encounter: Secondary | ICD-10-CM | POA: Diagnosis not present

## 2016-12-28 DIAGNOSIS — H0011 Chalazion right upper eyelid: Secondary | ICD-10-CM | POA: Diagnosis not present

## 2016-12-28 MED ORDER — EYE WASH OPHTH SOLN
OPHTHALMIC | Status: AC
  Filled 2016-12-28: qty 118

## 2016-12-28 MED ORDER — FLUORESCEIN SODIUM 0.6 MG OP STRP
ORAL_STRIP | OPHTHALMIC | Status: AC
  Filled 2016-12-28: qty 1

## 2016-12-28 MED ORDER — POLYMYXIN B-TRIMETHOPRIM 10000-0.1 UNIT/ML-% OP SOLN: 1.0000 [drp] | OPHTHALMIC | 0 refills | Status: DC

## 2016-12-28 NOTE — ED Triage Notes (Signed)
[facility] a complaint of a right eye pain that started 5 days.

## 2016-12-28 NOTE — Discharge Instructions (Signed)
Apply warm compresses to the right eye 3 or 4 times a day. Use the eyedrops as directed. If you continue to have pain, discomfort, watery eyes, visual problems call the ophthalmologist listed on page one for an appointment.

## 2016-12-28 NOTE — ED Provider Notes (Signed)
CSN: [id number]     Arrival date & time 12/28/16  1405 History   First MD Initiated Contact with Patient 12/28/16 1459     Chief Complaint  Patient presents with  . Eye Pain   (Consider location/radiation/quality/duration/timing/severity/associated sxs/prior Treatment) 56 year old female states that 4 days ago she started having right eye pain. She states she felt as though there was a hair or other foreign body in the right eye. It has been watering for the past 4 days. States her vision is a little blurry. Denies known injury. There is been no blunt force trauma. He states that much of the pain is in the right upper eyelid.      Past Medical History:  Diagnosis Date  . Anxiety   . Anxiety   . Hypertension    Past Surgical History:  Procedure Laterality Date  . BACK SURGERY     Spinal fusion  . BREAST SURGERY     Biopsy--benign  . FOOT SURGERY    . PELVIC LAPAROSCOPY    . SPINAL FUSION    . VAGINAL HYSTERECTOMY     Family History  Problem Relation Age of Onset  . Cancer Mother     Lung  . Hypertension Father   . Heart attack Father   . Hypertension Sister   . Hypertension Brother   . Diabetes Brother    Social History  Substance Use Topics  . Smoking status: Current Every Day Smoker    Packs/day: 0.50    Types: Cigarettes  . Smokeless tobacco: Never Used  . Alcohol use 0.0 oz/week     Comment: Rare   OB History    Gravida Para Term Preterm AB Living   2 2 2     2    SAB TAB Ectopic Multiple Live Births                 Review of Systems  Constitutional: Negative.   HENT: Negative.   Eyes: Positive for pain, discharge and redness. Negative for photophobia.  Respiratory: Negative.   Neurological: Negative.   All other systems reviewed and are negative.   Allergies  Hydrocodone; Latex; and Zithromax [azithromycin]  Home Medications   Prior to Admission medications   Medication Sig Start Date End Date Taking? Authorizing Provider  Calcium  Carbonate-Vitamin D (CALCIUM + D PO) Take by mouth.   Yes Historical Provider, MD  Cholecalciferol (VITAMIN D PO) Take by mouth.   Yes Historical Provider, MD  DULoxetine HCl (CYMBALTA PO) Take by mouth.   Yes Historical Provider, MD  hydrochlorothiazide (MICROZIDE) 12.5 MG capsule Take 12.5 mg by mouth daily.   Yes Historical Provider, MD  ibuprofen (ADVIL,MOTRIN) 800 MG tablet Take 1 tablet (800 mg total) by mouth 3 (three) times daily. 08/21/15  Yes Shari Upstill, PA-C  Meloxicam (MOBIC PO) Take by mouth.   Yes Historical Provider, MD  POTASSIUM PO Take by mouth.   Yes Historical Provider, MD  traMADol (ULTRAM) 50 MG tablet Take by mouth every 6 (six) hours as needed.   Yes Historical Provider, MD  trimethoprim-polymyxin b (POLYTRIM) ophthalmic solution Place 1 drop into the right eye every 4 (four) hours. 12/28/16   Hayden Rasmussen, NP   Meds Ordered and Administered this Visit  Medications - No data to display  BP 140/66 (BP Location: Right Arm)   Pulse 68   Temp 98 F (36.7 C) (Oral)   Resp 16   SpO2 100%  No data found.   Physical Exam  Constitutional: She is oriented to person, place, and time. She appears well-developed and well-nourished. No distress.  HENT:  Head: Normocephalic and atraumatic.  Eyes: EOM are normal. Pupils are equal, round, and reactive to light.  Upper and lower conjunctiva mildly erythematous. Swelling of the lower eyelid. Injection of the sclera. Tetracaine 2 drops was placed into the right eye. After which floor seen stain placed. There are small 3 punctate/dot-like areas of uptake at 6:00 of the cornea. No foreign bodies are seen. The upper lid has a small, tender nodular lesion. No foreign bodies beneath the eyelid. The eye was then irrigated. There was some difficulty with the eye exam and procedure as the patient was somewhat histrionic and resistant to eye exam.  Pulmonary/Chest: Effort normal.  Neurological: She is alert and oriented to person, place, and  time.  Skin: Skin is warm and dry.  Nursing note and vitals reviewed.   Urgent Care Course     Procedures (including critical care time)  Labs Review Labs Reviewed - No data to display  Imaging Review No results found.   Visual Acuity Review  Right Eye Distance:   Left Eye Distance:   Bilateral Distance:    Right Eye Near:   Left Eye Near:    Bilateral Near:         MDM   1. Abrasion of right cornea, initial encounter   2. Pain of right eye   3. Chalazion of right upper eyelid    Apply warm compresses to the right eye 3 or 4 times a day. Use the eyedrops as directed. If you continue to have pain, discomfort, watery eyes, visual problems call the ophthalmologist listed on page one for an appointment. Meds ordered this encounter  Medications  . trimethoprim-polymyxin b (POLYTRIM) ophthalmic solution    Sig: Place 1 drop into the right eye every 4 (four) hours.    Dispense:  10 mL    Refill:  0    Order Specific Question:   Supervising Provider    Answer:   Eustace MooreMURRAY, LAURA W [[id number]]       Hayden Rasmussenavid Erla Bacchi, NP 12/28/16 1555

## 2017-02-02 ENCOUNTER — Telehealth: Payer: Self-pay | Admitting: Cardiovascular Disease

## 2017-02-02 NOTE — Telephone Encounter (Signed)
[id number] 

## 2017-02-02 NOTE — Telephone Encounter (Signed)
Pt will be a new patient tomorow seeing  Dr Chilton Siiffany [place]le.iPt says she just have a few questions she needs to ask please.

## 2017-02-03 ENCOUNTER — Encounter: Payer: Self-pay | Admitting: Cardiovascular Disease

## 2017-02-03 ENCOUNTER — Ambulatory Visit (INDEPENDENT_AMBULATORY_CARE_PROVIDER_SITE_OTHER): Payer: Medicaid Other | Admitting: Cardiovascular Disease

## 2017-02-03 VITALS — BP 128/73 | HR 65 | Ht 69.0 in | Wt 165.2 lb

## 2017-02-03 DIAGNOSIS — R079 Chest pain, unspecified: Secondary | ICD-10-CM | POA: Diagnosis not present

## 2017-02-03 DIAGNOSIS — R0789 Other chest pain: Secondary | ICD-10-CM

## 2017-02-03 DIAGNOSIS — I10 Essential (primary) hypertension: Secondary | ICD-10-CM

## 2017-02-03 DIAGNOSIS — E785 Hyperlipidemia, unspecified: Secondary | ICD-10-CM

## 2017-02-03 DIAGNOSIS — R252 Cramp and spasm: Secondary | ICD-10-CM

## 2017-02-03 DIAGNOSIS — I739 Peripheral vascular disease, unspecified: Secondary | ICD-10-CM | POA: Diagnosis not present

## 2017-02-03 HISTORY — DX: Essential (primary) hypertension: I10

## 2017-02-03 HISTORY — DX: Hyperlipidemia, unspecified: E78.5

## 2017-02-03 HISTORY — DX: Cramp and spasm: R25.2

## 2017-02-03 HISTORY — DX: Other chest pain: R07.89

## 2017-02-03 NOTE — Patient Instructions (Addendum)
Medication Instructions:  Your physician recommends that you continue on your current medications as directed. Please refer to the Current Medication list given to you today.  Labwork: NONE  Testing/Procedures: Your physician has requested that you have an exercise tolerance test. For further information please visit [email or web address]. Please also follow instruction sheet, as given.  Your physician has requested that you have an ankle brachial index (ABI). During this test an ultrasound and blood pressure cuff are used to evaluate the arteries that supply the arms and legs with blood. Allow thirty minutes for this exam. There are no restrictions or special instructions.  Follow-Up: Your physician recommends that you schedule a follow-up appointment in: 1 MONTH OV  If you need a refill on your cardiac medications before your next appointment, please call your pharmacy.

## 2017-02-03 NOTE — Progress Notes (Signed)
Cardiology Office Note   Date:  02/03/2017   ID:  Donna Simpson, DOB 04-14-1961, MRN [id number]  PCP:  [facility]  Cardiologist:   Chilton Si, MD   Chief Complaint  Patient presents with  . Follow-up      History of Present Illness: Donna Simpson is a 56 y.o. female with hypertension  who presents for an evaluation of chest pain. Donna Simpson was at work earlier this week when she got a stressful phone call. Afterwards she started feeling sharp chest pain. The episode lasted for approximately 2 minutes. There is no associated shortness of breath, nausea, or diaphoresis. Later that evening she was able to work out at [employer] and had no exertional symptoms.  However, later that night at home she felt recurrent sharp chest pain.  She took a baby aspirin and went to bed.  She woke up in the middle of the niht with recurrent chest pain.  She notes that the symptoms feel better when she lays down. There is also associated tingling in her right hand. Donna Simpson is constantly under stress, she is a Orthoptist at [employer]. She wonders if this is the cause of her symptoms.  She is especially concerned because her father died of a heart attack at age 20. She reports a history of rheumatic fever as a child and has been told that she had a heart murmur in the past. She denies lower extremity edema, orthopnea, or PND. She exercises regularly and has no exertional symptoms.    Donna Simpson is very active in the community.  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  She has a son in the [employer] who is currently stationed abroad.  Past Medical History:  Diagnosis Date  . Anxiety   . Anxiety   . Atypical chest pain 02/03/2017  . Essential hypertension 02/03/2017  . Hyperlipidemia 02/03/2017  . Hypertension   . Leg cramping 02/03/2017    Past Surgical History:  Procedure Laterality Date  .  BACK SURGERY     Spinal fusion  . BREAST SURGERY     Biopsy--benign  . FOOT SURGERY    . PELVIC LAPAROSCOPY    . SPINAL FUSION    . VAGINAL HYSTERECTOMY       Current Outpatient Prescriptions  Medication Sig Dispense Refill  . ALPRAZolam (XANAX) 0.5 MG tablet Take 0.5 mg by mouth at bedtime.    . Calcium Carbonate-Vitamin D (CALCIUM + D PO) Take by mouth.    . Cholecalciferol (VITAMIN D PO) Take by mouth.    . DULoxetine HCl (CYMBALTA PO) Take by mouth.    . hydrochlorothiazide (MICROZIDE) 12.5 MG capsule Take 12.5 mg by mouth daily.    . Meloxicam (MOBIC PO) Take by mouth.    Marland Kitchen POTASSIUM PO Take by mouth.    . Topiramate (TOPAMAX PO) Take by mouth.    . traMADol (ULTRAM) 50 MG tablet Take by mouth every 6 (six) hours as needed.    . trimethoprim-polymyxin b (POLYTRIM) ophthalmic solution Place 1 drop into the right eye every 4 (four) hours. 10 mL 0  . ibuprofen (ADVIL,MOTRIN) 800 MG tablet Take 1 tablet (800 mg total) by mouth 3 (three) times daily. (Patient not taking: Reported on 02/03/2017) 21 tablet 0   No current facility-administered medications for this visit.     Allergies:  Hydrocodone; Latex; and Zithromax [azithromycin]    Social History:  The patient  reports that she has been smoking Cigarettes.  She has been smoking about 0.50 packs per day. She has never used smokeless tobacco. She reports that she drinks alcohol. She reports that she does not use drugs.   Family History:  The patient's family history includes Cancer in her mother; Congenital heart disease in her brother; Diabetes in her brother; Heart attack in her father; Heart failure in her brother and father; Hypertension in her brother, father, and sister; Valvular heart disease in her brother.    ROS:  Please see the history of present illness.   Otherwise, review of systems are positive for none.   All other systems are reviewed and negative.    PHYSICAL EXAM: VS:  BP 128/73 (BP Location: Left Arm)    Pulse 65   Ht 5\' 9"  (1.753 m)   Wt 74.9 kg (165 lb 3.2 oz)   BMI 24.40 kg/m  , BMI Body mass index is 24.4 kg/m. GENERAL:  Well appearing HEENT:  Pupils equal round and reactive, fundi not visualized, oral mucosa unremarkable NECK:  No jugular venous distention, waveform within normal limits, carotid upstroke brisk and symmetric, no bruits, no thyromegaly LYMPHATICS:  No cervical adenopathy LUNGS:  Clear to auscultation bilaterally HEART:  RRR.  PMI not displaced or sustained,S1 and S2 within normal limits, no S3, no S4, no clicks, no rubs, no murmurs ABD:  Flat, positive bowel sounds normal in frequency in pitch, no bruits, no rebound, no guarding, no midline pulsatile mass, no hepatomegaly, no splenomegaly EXT:  2 plus pulses throughout, no edema, no cyanosis no clubbing SKIN:  No rashes no nodules NEURO:  Cranial nerves II through XII grossly intact, motor grossly intact throughout PSYCH:  Cognitively intact, oriented to person place and time   EKG:  EKG is ordered today. The ekg ordered today demonstrates sinus rhythm.  Rate 62 bpm. Left axis deviation.   Recent Labs: 12/06/2016: ALT 16; BUN 18; Creat 0.77; Potassium 3.9; Sodium 136    Lipid Panel    Component Value Date/Time   CHOL 159 12/03/2015 1529   TRIG 104 12/03/2015 1529   HDL 63 12/03/2015 1529   CHOLHDL 2.5 12/03/2015 1529   VLDL 21 12/03/2015 1529   LDLCALC 75 12/03/2015 1529      Wt Readings from Last 3 Encounters:  02/03/17 74.9 kg (165 lb 3.2 oz)  12/06/16 72.6 kg (160 lb)  12/03/15 79.8 kg (176 lb)      ASSESSMENT AND PLAN:  # Chest pain:  Donna Simpson's chest pain is atypical. Given that she is able to exercise with exertion, it seems very unlikely that this is due to ischemia. We discussed this fact. However, given her family history of heart attacks, she would like to have a stress test. This is reasonable. We will obtain an ETT. She will start aspirin 81 mg for now. If this is within normal limits  aspirin can be stopped.  # ABI: Donna Simpson reports leg cramping at rest and with walking. We will obtain ABIs.  # Hypertension: Blood pressure well-controlled. Continue hydrochlorothiazide.  # Tobacco abuse: Currently smoking 2-3 cigarettes daily.  Encouraged smoking cessation. We discussed this with 5 minutes. She understands the importance of quitting.  Current medicines are reviewed at length with the patient today.  The patient does not have concerns regarding medicines.  The following changes have been made:  Aspirin 81 mg daily  Labs/ tests ordered today include:   Orders Placed This Encounter  Procedures  . Exercise Tolerance Test  . EKG 12-Lead     Disposition:   FU with Donna Helbig C. Duke Salviaandolph, MD, [facility] in 1 month    This note was written with the assistance of speech recognition software.  Please excuse any transcriptional errors.  Signed, Donna Osgood C. Duke Salviaandolph, MD, [facility]  02/03/2017 1:18 PM    [facility]

## 2017-02-06 NOTE — Telephone Encounter (Signed)
Patient seen by Dr. Duke Salviaandolph Friday.

## 2017-02-08 ENCOUNTER — Encounter (HOSPITAL_COMMUNITY): Payer: Self-pay | Admitting: Emergency Medicine

## 2017-02-08 ENCOUNTER — Ambulatory Visit (HOSPITAL_COMMUNITY)
Admission: EM | Admit: 2017-02-08 | Discharge: 2017-02-08 | Disposition: A | Discharge status: Home / Self Care | Payer: Medicaid Other | Attending: Family Medicine | Admitting: Family Medicine

## 2017-02-08 DIAGNOSIS — H168 Other keratitis: Secondary | ICD-10-CM

## 2017-02-08 MED ORDER — TOBRAMYCIN-DEXAMETHASONE 0.3-0.1 % OP OINT: 1.0000 "application " | TOPICAL_OINTMENT | Freq: Two times a day (BID) | OPHTHALMIC | 0 refills | Status: DC

## 2017-02-08 NOTE — ED Triage Notes (Signed)
[facility] a [facility] x 4 days.

## 2017-02-08 NOTE — ED Provider Notes (Signed)
[facility]    CSN: [id number] Arrival date & time: 02/08/17  1555     History   Chief Complaint Chief Complaint  Patient presents with  . Eye Drainage    HPI Donna Simpson is a 56 y.o. female.   56 year old female who presents with 4 days of irritation in the right eye. This is a recurrent problem.      Past Medical History:  Diagnosis Date  . Anxiety   . Anxiety   . Atypical chest pain 02/03/2017  . Essential hypertension 02/03/2017  . Hyperlipidemia 02/03/2017  . Hypertension   . Leg cramping 02/03/2017    Patient Active Problem List   Diagnosis Date Noted  . Essential hypertension 02/03/2017  . Leg cramping 02/03/2017  . Atypical chest pain 02/03/2017    Past Surgical History:  Procedure Laterality Date  . BACK SURGERY     Spinal fusion  . BREAST SURGERY     Biopsy--benign  . FOOT SURGERY    . PELVIC LAPAROSCOPY    . SPINAL FUSION    . VAGINAL HYSTERECTOMY      OB History    Gravida Para Term Preterm AB Living   2 2 2     2    SAB TAB Ectopic Multiple Live Births                   Home Medications    Prior to Admission medications   Medication Sig Start Date End Date Taking? Authorizing Provider  ALPRAZolam Prudy Feeler(XANAX) 0.5 MG tablet Take 0.5 mg by mouth at bedtime.   Yes Historical Provider, MD  Calcium Carbonate-Vitamin D (CALCIUM + D PO) Take by mouth.   Yes Historical Provider, MD  Cholecalciferol (VITAMIN D PO) Take by mouth.   Yes Historical Provider, MD  hydrochlorothiazide (MICROZIDE) 12.5 MG capsule Take 12.5 mg by mouth daily.   Yes Historical Provider, MD  Meloxicam (MOBIC PO) Take by mouth.   Yes Historical Provider, MD  POTASSIUM PO Take by mouth.   Yes Historical Provider, MD  traMADol (ULTRAM) 50 MG tablet Take by mouth every 6 (six) hours as needed.   Yes Historical Provider, MD  trimethoprim-polymyxin b (POLYTRIM) ophthalmic solution Place 1 drop into the right eye every 4 (four) hours. 12/28/16  Yes Hayden Rasmussenavid Mabe, NP    tobramycin-dexamethasone [facility](TOBRADEX) ophthalmic ointment Place 1 application into the right eye 2 (two) times daily. 02/08/17   Elvina SidleKurt Lonald Troiani, MD    Family History Family History  Problem Relation Age of Onset  . Cancer Mother     Lung  . Hypertension Father   . Heart attack Father   . Heart failure Father   . Hypertension Sister   . Hypertension Brother   . Diabetes Brother   . Congenital heart disease Brother   . Valvular heart disease Brother   . Heart failure Brother     Social History Social History  Substance Use Topics  . Smoking status: Current Every Day Smoker    Packs/day: 0.50    Types: Cigarettes  . Smokeless tobacco: Never Used  . Alcohol use 0.0 oz/week     Comment: Rare     Allergies   Hydrocodone; Latex; and Zithromax [azithromycin]   Review of Systems Review of Systems  Eyes: Positive for photophobia, pain and redness.  All other systems reviewed and are negative.    Physical Exam Triage Vital Signs ED Triage Vitals  Enc Vitals Group     BP 02/08/17 1625  119/75     Pulse Rate 02/08/17 1625 72     Resp 02/08/17 1625 18     Temp 02/08/17 1625 98.4 F (36.9 C)     Temp Source 02/08/17 1625 Oral     SpO2 02/08/17 1625 99 %     Weight --      Height --      Head Circumference --      Peak Flow --      Pain Score 02/08/17 1623 6     Pain Loc --      Pain Edu? --      Excl. in GC? --    No data found.   Updated Vital Signs BP 119/75 (BP Location: Right Arm)   Pulse 72   Temp 98.4 F (36.9 C) (Oral)   Resp 18   SpO2 99%      Physical Exam  Constitutional: She is oriented to person, place, and time. She appears well-developed and well-nourished.  HENT:  Right Ear: External ear normal.  Left Ear: External ear normal.  Mouth/Throat: Oropharynx is clear and moist.  Eyes: Conjunctivae and EOM are normal. Pupils are equal, round, and reactive to light.  Neck: Normal range of motion. Neck supple.  Cardiovascular: Normal rate,  regular rhythm and normal heart sounds.   Pulmonary/Chest: Effort normal.  Musculoskeletal: Normal range of motion.  Neurological: She is alert and oriented to person, place, and time.  Skin: Skin is warm and dry.  Nursing note and vitals reviewed.    UC Treatments / Results  Labs (all labs ordered are listed, but only abnormal results are displayed) Labs Reviewed - No data to display  EKG  EKG Interpretation None       Radiology No results found.  Procedures Procedures (including critical care time)  Medications Ordered in UC Medications - No data to display   Initial Impression / Assessment and Plan / UC Course  I have reviewed the triage vital signs and the nursing notes.  Pertinent labs & imaging results that were available during my care of the patient were reviewed by me and considered in my medical decision making (see chart for details).     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     Elvina Sidle, MD 02/08/17 1650

## 2017-02-08 NOTE — Discharge Instructions (Signed)
Use a product called Lacri-Lube to prevent this from happening in the future. Simply apply the Lacri-Lube to the lower lid before going to sleep.

## 2017-02-27 ENCOUNTER — Ambulatory Visit: Payer: Medicaid Other | Admitting: Cardiology

## 2017-03-03 ENCOUNTER — Telehealth (HOSPITAL_COMMUNITY): Payer: Self-pay

## 2017-03-03 NOTE — Telephone Encounter (Signed)
Encounter complete. 

## 2017-03-08 ENCOUNTER — Ambulatory Visit (HOSPITAL_COMMUNITY)
Admission: RE | Admit: 2017-03-08 | Discharge: 2017-03-08 | Disposition: A | Discharge status: Home / Self Care | Payer: Medicaid Other | Source: Ambulatory Visit | Attending: Cardiovascular Disease | Admitting: Cardiovascular Disease

## 2017-03-08 ENCOUNTER — Other Ambulatory Visit: Payer: Self-pay | Admitting: Cardiovascular Disease

## 2017-03-08 DIAGNOSIS — R079 Chest pain, unspecified: Secondary | ICD-10-CM

## 2017-03-08 DIAGNOSIS — I739 Peripheral vascular disease, unspecified: Secondary | ICD-10-CM | POA: Insufficient documentation

## 2017-03-08 DIAGNOSIS — I10 Essential (primary) hypertension: Secondary | ICD-10-CM | POA: Insufficient documentation

## 2017-03-08 LAB — EXERCISE TOLERANCE TEST
Estimated workload: 12.4 METS
Exercise duration (min): 10 min
Exercise duration (sec): 24 s
MPHR: 166 {beats}/min
Peak HR: 160 {beats}/min
Percent HR: 96 %
RPE: 16
Rest HR: 77 {beats}/min

## 2017-03-10 ENCOUNTER — Ambulatory Visit (INDEPENDENT_AMBULATORY_CARE_PROVIDER_SITE_OTHER): Payer: Medicaid Other | Admitting: Cardiovascular Disease

## 2017-03-10 ENCOUNTER — Encounter: Payer: Self-pay | Admitting: Cardiovascular Disease

## 2017-03-10 VITALS — BP 110/74 | HR 66 | Ht 69.0 in | Wt 164.2 lb

## 2017-03-10 DIAGNOSIS — R079 Chest pain, unspecified: Secondary | ICD-10-CM | POA: Diagnosis not present

## 2017-03-10 DIAGNOSIS — Z720 Tobacco use: Secondary | ICD-10-CM

## 2017-03-10 DIAGNOSIS — I10 Essential (primary) hypertension: Secondary | ICD-10-CM

## 2017-03-10 HISTORY — DX: Tobacco use: Z72.0

## 2017-03-10 MED ORDER — VARENICLINE TARTRATE 0.5 MG X 11 & 1 MG X 42 PO MISC: ORAL | 0 refills | Status: DC

## 2017-03-10 MED ORDER — VARENICLINE TARTRATE 1 MG PO TABS: 1.0000 mg | ORAL_TABLET | Freq: Two times a day (BID) | ORAL | 2 refills | Status: DC

## 2017-03-10 NOTE — Patient Instructions (Addendum)
Medication Instructions:  START CHANTIX  Take one 0.5 mg tablet by mouth once daily for 3 days, then increase to one 0.5 mg tablet twice daily for 4 days, then increase to one 1 mg tablet twice daily. START 1 WEEK PRIOR TO TRYING TO QUIT   AFTER FINISHING STARTER PACK CONTINUE 1 MG TWICE A DAY  START NICOTINE PATCH 14 MG FOR 4-6 WEEKS AND THEN DECREASE TO 7 MG FOR 2 WEEKS  STOP ASPIRIN   Labwork: NONE  Testing/Procedures: NONE  Follow-Up: AS NEEDED

## 2017-03-10 NOTE — Progress Notes (Signed)
Cardiology Office Note   Date:  03/10/2017   ID:  Donna Simpson, DOB [birth year], MRN [id number]  PCP:  [facility]  Cardiologist:   Chilton Si, MD   Chief Complaint  Patient presents with  . Follow-up    stress test       History of Present Illness: Donna Simpson is a 56 y.o. female with hypertension who presents for follow up.  She was first seen 01/2017 for an evaluation of chest pain. She had an episode of chest pain that occurred in the setting of a stressful situation.  She was referred for an ETT 03/08/17 that was negative for ischemia.  She achieved 12.4 METs on the Bruce protocol. She also reported leg cramping and was referred for ABIs 03/08/17 that were within normal limits.  She has been Feeling well and denies recurrent chest pain. She thinks that the episode was related to stress. 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   She has a son in the [employer] who is currently stationed abroad.  Her only complaint at this time is allergies.    Past Medical History:  Diagnosis Date  . Anxiety   . Anxiety   . Atypical chest pain 02/03/2017  . Essential hypertension 02/03/2017  . Hyperlipidemia 02/03/2017  . Hypertension   . Leg cramping 02/03/2017  . Tobacco abuse 03/10/2017    Past Surgical History:  Procedure Laterality Date  . BACK SURGERY     Spinal fusion  . BREAST SURGERY     Biopsy--benign  . FOOT SURGERY    . PELVIC LAPAROSCOPY    . SPINAL FUSION    . VAGINAL HYSTERECTOMY       Current Outpatient Prescriptions  Medication Sig Dispense Refill  . ALPRAZolam (XANAX) 0.5 MG tablet Take 0.5 mg by mouth at bedtime.    . Calcium Carbonate-Vitamin D (CALCIUM + D PO) Take by mouth.    . Cholecalciferol (VITAMIN D PO) Take by mouth.    . hydrochlorothiazide (MICROZIDE) 12.5 MG capsule Take 12.5 mg by mouth daily.    . meloxicam (MOBIC) 15 MG tablet Take 1 tablet by mouth as needed.  5  . montelukast (SINGULAIR) 10 MG tablet Take 1  tablet by mouth as needed.  5  . POTASSIUM PO Take 1 tablet by mouth daily.     Marland Kitchen tobramycin-dexamethasone (TOBRADEX) ophthalmic ointment Place 1 application into the right eye 2 (two) times daily. 3.5 g 0  . traMADol (ULTRAM) 50 MG tablet Take by mouth every 6 (six) hours as needed.    . trimethoprim-polymyxin b (POLYTRIM) ophthalmic solution Place 1 drop into the right eye every 4 (four) hours. 10 mL 0  . VOLTAREN 1 % GEL Apply 1 application topically as needed.  2  . varenicline (CHANTIX CONTINUING MONTH PAK) 1 MG tablet Take 1 tablet (1 mg total) by mouth 2 (two) times daily. 60 tablet 2  . varenicline (CHANTIX PAK) 0.5 MG X 11 & 1 MG X 42 tablet Take one 0.5 mg tablet by mouth once daily for 3 days, then increase to one 0.5 mg tablet twice daily for 4 days, then increase to one 1 mg tablet twice daily. 53 tablet 0   No current facility-administered medications for this visit.     Allergies:   Hydrocodone; Latex; and Zithromax [azithromycin]    Social History:  The patient  reports that she has been smoking Cigarettes.  She has been smoking about  0.50 packs per day. She has never used smokeless tobacco. She reports that she drinks alcohol. She reports that she does not use drugs.   Family History:  The patient's family history includes Cancer in her mother; Congenital heart disease in her brother; Diabetes in her brother; Heart attack in her father; Heart failure in her brother and father; Hypertension in her brother, father, and sister; Valvular heart disease in her brother.    ROS:  Please see the history of present illness.   Otherwise, review of systems are positive for none.   All other systems are reviewed and negative.    PHYSICAL EXAM: VS:  BP 110/74   Pulse 66   Ht  (1.753 m)   Wt 74.5 kg (164 lb 3.2 oz)   BMI 24.25 kg/m  , BMI Body mass index is 24.25 kg/m. GENERAL:  Well appearing HEENT:  Pupils equal round and reactive, fundi not visualized, oral mucosa  unremarkable NECK:  No jugular venous distention, waveform within normal limits, carotid upstroke brisk and symmetric, no bruits.  LYMPHATICS:  No cervical adenopathy LUNGS:  Clear to auscultation bilaterally.  No acute distress HEART:  RRR.  PMI not displaced or sustained,S1 and S2 within normal limits, no S3, no S4, no clicks, no rubs, no murmurs ABD:  Flat, positive bowel sounds normal in frequency in pitch, no bruits, no rebound, no guarding, no midline pulsatile mass, no hepatomegaly, no splenomegaly EXT:  2 plus pulses throughout, no edema, no cyanosis no clubbing SKIN:  No rashes no nodules NEURO:  Cranial nerves II through XII grossly intact, motor grossly intact throughout PSYCH:  Cognitively intact, oriented to person place and time   EKG:  EKG is not ordered today. The ekg ordered 02/03/17 demonstrates sinus rhythm.  Rate 62 bpm. Left axis deviation.  ETT 03/08/17: No ischemia.  12.4 METs.  ABI 03/08/17: R 1.2, L 1.3  Recent Labs: 12/06/2016: ALT 16; BUN 18; Creat 0.77; Potassium 3.9; Sodium 136    Lipid Panel    Component Value Date/Time   CHOL 159 12/03/2015 1529   TRIG 104 12/03/2015 1529   HDL 63 12/03/2015 1529   CHOLHDL 2.5 12/03/2015 1529   VLDL 21 12/03/2015 1529   LDLCALC 75 12/03/2015 1529      Wt Readings from Last 3 Encounters:  03/10/17 74.5 kg (164 lb 3.2 oz)  02/03/17 74.9 kg (165 lb 3.2 oz)  12/06/16 72.6 kg (160 lb)      ASSESSMENT AND PLAN:  # Chest pain:  Resolved.  ETT negative for ischemia.  Stop aspirin.  # ABI: ABI's normal bilaterally.  # Hypertension: Blood pressure well-controlled. Continue hydrochlorothiazide.  # Tobacco abuse: Currently smoking 2-3 cigarettes daily.  She is ready to quit.  She will start Chantix and nicotine patches today.  Start with 14 mg for 6 weeks followed by 7 mg for two weeks.  Current medicines are reviewed at length with the patient today.  The patient does not have concerns regarding medicines.  The  following changes have been made:  Stop aspirin 81 mg daily   Labs/ tests ordered today include:   No orders of the defined types were placed in this encounter.    Disposition:   FU with Bakary Bramer C. Duke Salvia, MD, [facility] as needed.    This note was written with the assistance of speech recognition software.  Please excuse any transcriptional errors.  Signed, Johne Buckle C. Duke Salvia, MD, [facility]  03/10/2017 10:53 AM    [facility]

## 2017-08-31 ENCOUNTER — Other Ambulatory Visit: Payer: Self-pay | Admitting: Gynecology

## 2017-08-31 DIAGNOSIS — Z1231 Encounter for screening mammogram for malignant neoplasm of breast: Secondary | ICD-10-CM

## 2017-09-26 ENCOUNTER — Ambulatory Visit
Admission: RE | Admit: 2017-09-26 | Discharge: 2017-09-26 | Disposition: A | Discharge status: Home / Self Care | Payer: Medicaid Other | Source: Ambulatory Visit | Attending: Gynecology | Admitting: Gynecology

## 2017-09-26 DIAGNOSIS — Z1231 Encounter for screening mammogram for malignant neoplasm of breast: Secondary | ICD-10-CM

## 2018-07-26 ENCOUNTER — Ambulatory Visit (INDEPENDENT_AMBULATORY_CARE_PROVIDER_SITE_OTHER): Payer: Medicaid Other | Admitting: Gynecology

## 2018-07-26 ENCOUNTER — Encounter: Payer: Self-pay | Admitting: Gynecology

## 2018-07-26 VITALS — BP 112/70 | Ht 67.75 in | Wt 188.0 lb

## 2018-07-26 DIAGNOSIS — N952 Postmenopausal atrophic vaginitis: Secondary | ICD-10-CM | POA: Diagnosis not present

## 2018-07-26 DIAGNOSIS — Z01419 Encounter for gynecological examination (general) (routine) without abnormal findings: Secondary | ICD-10-CM

## 2018-07-26 DIAGNOSIS — Z0000 Encounter for general adult medical examination without abnormal findings: Secondary | ICD-10-CM | POA: Diagnosis not present

## 2018-07-26 NOTE — Patient Instructions (Signed)
Follow-up in 1 year for annual exam, sooner if any issues. 

## 2018-07-26 NOTE — Progress Notes (Signed)
    Donna Simpson Sep 03, 1961 [id number]        57 y.o.  [id number] for annual gynecologic exam.  Doing well without complaints.  Past medical history,surgical history, problem list, medications, allergies, family history and social history were all reviewed and documented as reviewed in the EPIC chart.  ROS:  Performed with pertinent positives and negatives included in the history, assessment and plan.   Additional significant findings : None   Exam: Biomedical scientistBlanca assistant Vitals:   07/26/18 1007  BP: 112/70  Weight: 188 lb (85.3 kg)  Height: 5' 7.75" (1.721 m)   Body mass index is 28.8 kg/m.  General appearance:  Normal affect, orientation and appearance. Skin: Grossly normal HEENT: Without gross lesions.  No cervical or supraclavicular adenopathy. Thyroid normal.  Lungs:  Clear without wheezing, rales or rhonchi Cardiac: RR, without RMG Abdominal:  Soft, nontender, without masses, guarding, rebound, organomegaly or hernia Breasts:  Examined lying and sitting without masses, retractions, discharge or axillary adenopathy. Pelvic:  Ext, BUS, Vagina: Normal with mild atrophic changes  Adnexa: Without masses or tenderness    Anus and perineum: Normal   Rectovaginal: Normal sphincter tone without palpated masses or tenderness.    Assessment/Plan:  57 y.o. [id number] female for annual gynecologic exam.   1. Postmenopausal.  Status post TVH in the past.  Doing well without significant menopausal symptoms. 2. Osteopenia.  DEXA 2018 T score -1.8 FRAX 3% / 0.3%.  Discussed exercising on a regular basis as well as adequate calcium and vitamin D.  Recommend repeat DEXA in another year or 2. 3. Pap smear 2018.  No Pap smear done today.  No history of significant abnormal Pap smears.  Options to stop screening per current screening guidelines based on hysterectomy reviewed.  Will reassess on an annual basis. 4. Reports mammography this year.  Continue with annual mammography next year when due.   Breast exam normal today. 5. Colonoscopy 2015.  Repeat at their recommended interval. 6. Health maintenance.  No routine lab work done as patient reports this done elsewhere.  Follow-up 1 year, sooner as needed.   Donna Lordsimothy P Donna Maciver MD, 10:33 AM 07/26/2018

## 2018-09-13 ENCOUNTER — Other Ambulatory Visit: Payer: Self-pay | Admitting: Gynecology

## 2018-09-13 DIAGNOSIS — Z1231 Encounter for screening mammogram for malignant neoplasm of breast: Secondary | ICD-10-CM

## 2018-10-02 ENCOUNTER — Ambulatory Visit
Admission: RE | Admit: 2018-10-02 | Discharge: 2018-10-02 | Disposition: A | Discharge status: Home / Self Care | Payer: Medicaid Other | Source: Ambulatory Visit | Attending: Gynecology | Admitting: Gynecology

## 2018-10-02 DIAGNOSIS — Z1231 Encounter for screening mammogram for malignant neoplasm of breast: Secondary | ICD-10-CM

## 2019-06-04 ENCOUNTER — Telehealth: Payer: Self-pay | Admitting: Cardiology

## 2019-06-04 NOTE — Telephone Encounter (Signed)

## 2019-06-05 ENCOUNTER — Ambulatory Visit (INDEPENDENT_AMBULATORY_CARE_PROVIDER_SITE_OTHER): Payer: Medicaid Other | Admitting: Cardiology

## 2019-06-05 ENCOUNTER — Other Ambulatory Visit: Payer: Self-pay

## 2019-06-05 VITALS — BP 127/44 | HR 88 | Temp 97.3°F | Ht 69.0 in | Wt 182.0 lb

## 2019-06-05 DIAGNOSIS — R0789 Other chest pain: Secondary | ICD-10-CM

## 2019-06-05 DIAGNOSIS — Z8249 Family history of ischemic heart disease and other diseases of the circulatory system: Secondary | ICD-10-CM | POA: Diagnosis not present

## 2019-06-05 DIAGNOSIS — I10 Essential (primary) hypertension: Secondary | ICD-10-CM | POA: Diagnosis not present

## 2019-06-05 NOTE — Assessment & Plan Note (Signed)
Controlled.  

## 2019-06-05 NOTE — Progress Notes (Signed)
Cardiology Office Note:    Date:  06/05/2019   ID:  Demetrius Charityamela F Appleton, DOB 10-03-1961, MRN [id number]  PCP:  Gilda CreasePavelock, Richard M, MD  Cardiologist:  Chilton Siiffany [place], MD  Electrophysiologist:  None   Referring MD: Gilda CreasePavelock, Richard M, MD   Chest pain  History of Present Illness:    Demetrius Charityamela F Laplante is a 58 y.o. female with a hx of atypical chest pain.  She has a family history of heart issues, her father had an MI at 3767 and her brother has an "enlarged heart".  The patient had a GXT in 2018 that was normal.  We have not seen her since. The patient woke up four days ago with "cramp like" mid chest pain.  It was worse with deep breathing.  She then had localized Lt chest pain, worse with movement. Two days ago she took Flexeril and today her symptoms are much better.   Past Medical History:  Diagnosis Date  . Anxiety   . Anxiety   . Atypical chest pain 02/03/2017  . Essential hypertension 02/03/2017  . Hyperlipidemia 02/03/2017  . Hypertension   . Leg cramping 02/03/2017  . Tobacco abuse 03/10/2017    Past Surgical History:  Procedure Laterality Date  . BACK SURGERY     Spinal fusion  . BREAST SURGERY     Biopsy--benign  . FOOT SURGERY    . PELVIC LAPAROSCOPY    . SPINAL FUSION    . VAGINAL HYSTERECTOMY      Current Medications: Current Meds  Medication Sig  . Calcium Carbonate-Vitamin D (CALCIUM + D PO) Take by mouth.  . Cholecalciferol (VITAMIN D PO) Take by mouth.  . hydrochlorothiazide (MICROZIDE) 12.5 MG capsule Take 12.5 mg by mouth daily.  . montelukast (SINGULAIR) 10 MG tablet Take 1 tablet by mouth as needed.  . traMADol (ULTRAM) 50 MG tablet Take by mouth every 6 (six) hours as needed.  . VOLTAREN 1 % GEL Apply 1 application topically as needed.     Allergies:   Hydrocodone, Latex, and Zithromax [azithromycin]   Social History   Socioeconomic History  . Marital status: Divorced    Spouse name: Not on file  . Number of children: Not on file  . Years of  education: Not on file  . Highest education level: Not on file  Occupational History  . Not on file  Social Needs  . Financial resource strain: Not on file  . Food insecurity    Worry: Not on file    Inability: Not on file  . Transportation needs    Medical: Not on file    Non-medical: Not on file  Tobacco Use  . Smoking status: Current Every Day Smoker    Packs/day: 0.50    Types: Cigarettes  . Smokeless tobacco: Never Used  Substance and Sexual Activity  . Alcohol use: Yes    Alcohol/week: 0.0 standard drinks    Comment: Rare  . Drug use: No  . Sexual activity: Yes    Partners: Male    Comment: 1st intercourse 58 yo-5 partners  Lifestyle  . Physical activity    Days per week: Not on file    Minutes per session: Not on file  . Stress: Not on file  Relationships  . Social Musicianconnections    Talks on phone: Not on file    Gets together: Not on file    Attends religious service: Not on file    Active member of club or organization: Not on  file    Attends meetings of clubs or organizations: Not on file    Relationship status: Not on file  Other Topics Concern  . Not on file  Social History Narrative  . Not on file     Family History: The patient's family history includes Cancer in her mother; Congenital heart disease in her brother; Diabetes in her brother; Heart attack in her father; Heart failure in her brother and father; Hypertension in her brother, father, and sister; Valvular heart disease in her brother.  ROS:   Please see the history of present illness.     All other systems reviewed and are negative.  EKGs/Labs/Other Studies Reviewed:    The following studies were reviewed today:   EKG:  EKG is ordered today.  The ekg ordered today demonstrates NSR, poor anterior RW  Recent Labs: No results found for requested labs within last 8760 hours.  Recent Lipid Panel    Component Value Date/Time   CHOL 159 12/03/2015 1529   TRIG 104 12/03/2015 1529   HDL 63  12/03/2015 1529   CHOLHDL 2.5 12/03/2015 1529   VLDL 21 12/03/2015 1529   LDLCALC 75 12/03/2015 1529    Physical Exam:    VS:  BP (!) 127/44   Pulse 88   Temp (!) 97.3 F (36.3 C)   Ht 5\' 9"  (1.753 m)   Wt 182 lb (82.6 kg)   SpO2 99%   BMI 26.88 kg/m     Wt Readings from Last 3 Encounters:  06/05/19 182 lb (82.6 kg)  07/26/18 188 lb (85.3 kg)  03/10/17 164 lb 3.2 oz (74.5 kg)     GEN:  Well nourished, well developed in no acute distress HEENT: Normal NECK: No JVD; No carotid bruits LYMPHATICS: No lymphadenopathy CARDIAC: RRR, no murmurs, rubs, gallops RESPIRATORY:  Clear to auscultation without rales, wheezing or rhonchi  ABDOMEN: Soft, non-tender, non-distended MUSCULOSKELETAL:  No edema; No deformity  SKIN: Warm and dry NEUROLOGIC:  Alert and oriented x 3 PSYCHIATRIC:  Normal affect   ASSESSMENT:    Atypical chest pain .  Family history of cardiomyopathy .  Essential hypertension Controlled  PLAN:    Check echo [facility](FmHx of brother with "enlarged heart")- if any WMA or recurrent chest pain consider coronary CTA.  Medication Adjustments/Labs Tests Ordered: Current medicines are reviewed at length with the patient today.  Concerns regarding medicines are outlined above.  Orders Placed This Encounter  Procedures  . EKG 12-Lead  . ECHOCARDIOGRAM COMPLETE   No orders of the defined types were placed in this encounter.   Patient Instructions  Medication Instructions:  Your physician recommends that you continue on your current medications as directed. Please refer to the Current Medication list given to you today.  If you need a refill on your cardiac medications before your next appointment, please call your pharmacy.   Lab work: NONE  If you have labs (blood work) drawn today and your tests are completely normal, you will receive your results only by: Marland Kitchen. MyChart Message (if you have MyChart) OR . A paper copy in the mail If you have any lab test that  is abnormal or we need to change your treatment, we will call you to review the results.  Testing/Procedures: Your physician has requested that you have an echocardiogram. Echocardiography is a painless test that uses sound waves to create images of your heart. It provides your doctor with information about the size and shape of your heart and how well  your heart's chambers and valves are working. This procedure takes approximately one hour. There are no restrictions for this procedure. [place]  Follow-Up: At [facility], you and your health needs are our priority.  As part of our continuing mission to provide you with exceptional heart care, we have created designated Provider Care Teams.  These Care Teams include your primary Cardiologist (physician) and Advanced Practice Providers (APPs -  Physician Assistants and Nurse Practitioners) who all work together to provide you with the care you need, when you need it. You will need a follow up appointment in 3 months.  Please call our office 2 months in advance to schedule this appointment.  You may see Skeet Latch, MD or one of the following Advanced Practice Providers on your designated Care Team:   Kerin Ransom, PA-C Roby Lofts, [place] . Sande Rives, PA-C  Any Other Special Instructions Will Be Listed Below (If Applicable).      Angelena Form, PA-C  06/05/2019 12:06 PM    [facility]

## 2019-06-05 NOTE — Patient Instructions (Signed)
Medication Instructions:  Your physician recommends that you continue on your current medications as directed. Please refer to the Current Medication list given to you today.  If you need a refill on your cardiac medications before your next appointment, please call your pharmacy.   Lab work: NONE  If you have labs (blood work) drawn today and your tests are completely normal, you will receive your results only by: Marland Kitchen MyChart Message (if you have MyChart) OR . A paper copy in the mail If you have any lab test that is abnormal or we need to change your treatment, we will call you to review the results.  Testing/Procedures: Your physician has requested that you have an echocardiogram. Echocardiography is a painless test that uses sound waves to create images of your heart. It provides your doctor with information about the size and shape of your heart and how well your heart's chambers and valves are working. This procedure takes approximately one hour. There are no restrictions for this procedure. [place]  Follow-Up: At [facility], you and your health needs are our priority.  As part of our continuing mission to provide you with exceptional heart care, we have created designated Provider Care Teams.  These Care Teams include your primary Cardiologist (physician) and Advanced Practice Providers (APPs -  Physician Assistants and Nurse Practitioners) who all work together to provide you with the care you need, when you need it. You will need a follow up appointment in 3 months.  Please call our office 2 months in advance to schedule this appointment.  You may see Skeet Latch, MD or one of the following Advanced Practice Providers on your designated Care Team:   Kerin Ransom, PA-C Roby Lofts, [place] . Sande Rives, PA-C  Any Other Special Instructions Will Be Listed Below (If Applicable).

## 2019-06-13 ENCOUNTER — Ambulatory Visit (HOSPITAL_COMMUNITY): Payer: Medicaid Other | Attending: Cardiovascular Disease

## 2019-06-13 ENCOUNTER — Other Ambulatory Visit: Payer: Self-pay

## 2019-06-13 DIAGNOSIS — I10 Essential (primary) hypertension: Secondary | ICD-10-CM | POA: Diagnosis present

## 2019-06-13 DIAGNOSIS — R0789 Other chest pain: Secondary | ICD-10-CM | POA: Diagnosis present

## 2019-06-17 ENCOUNTER — Telehealth: Payer: Self-pay | Admitting: Cardiovascular Disease

## 2019-06-17 NOTE — Telephone Encounter (Signed)
Contacted patient and gave her her ECHO results, she voiced understanding and thanked me for my call.

## 2019-06-17 NOTE — Telephone Encounter (Signed)
  Patient is returning a call for echo results 

## 2019-08-13 ENCOUNTER — Encounter: Payer: Self-pay | Admitting: Gynecology

## 2019-09-03 ENCOUNTER — Other Ambulatory Visit: Payer: Self-pay | Admitting: Gynecology

## 2019-09-03 DIAGNOSIS — Z1231 Encounter for screening mammogram for malignant neoplasm of breast: Secondary | ICD-10-CM

## 2019-09-09 ENCOUNTER — Ambulatory Visit: Payer: Medicaid Other | Admitting: Cardiovascular Disease

## 2019-10-29 ENCOUNTER — Ambulatory Visit
Admission: RE | Admit: 2019-10-29 | Discharge: 2019-10-29 | Disposition: A | Discharge status: Home / Self Care | Payer: Medicaid Other | Source: Ambulatory Visit | Attending: Gynecology | Admitting: Gynecology

## 2019-10-29 ENCOUNTER — Other Ambulatory Visit: Payer: Self-pay

## 2019-10-29 DIAGNOSIS — Z1231 Encounter for screening mammogram for malignant neoplasm of breast: Secondary | ICD-10-CM

## 2020-02-20 ENCOUNTER — Other Ambulatory Visit: Payer: Self-pay

## 2020-02-20 ENCOUNTER — Encounter (HOSPITAL_COMMUNITY): Payer: Self-pay

## 2020-02-20 ENCOUNTER — Ambulatory Visit (HOSPITAL_COMMUNITY)
Admission: EM | Admit: 2020-02-20 | Discharge: 2020-02-20 | Disposition: A | Discharge status: Home / Self Care | Payer: Medicaid Other | Attending: Family Medicine | Admitting: Family Medicine

## 2020-02-20 DIAGNOSIS — Z801 Family history of malignant neoplasm of trachea, bronchus and lung: Secondary | ICD-10-CM | POA: Insufficient documentation

## 2020-02-20 DIAGNOSIS — Z881 Allergy status to other antibiotic agents status: Secondary | ICD-10-CM | POA: Insufficient documentation

## 2020-02-20 DIAGNOSIS — F419 Anxiety disorder, unspecified: Secondary | ICD-10-CM | POA: Diagnosis not present

## 2020-02-20 DIAGNOSIS — Z20822 Contact with and (suspected) exposure to covid-19: Secondary | ICD-10-CM | POA: Insufficient documentation

## 2020-02-20 DIAGNOSIS — I10 Essential (primary) hypertension: Secondary | ICD-10-CM | POA: Diagnosis not present

## 2020-02-20 DIAGNOSIS — Z79899 Other long term (current) drug therapy: Secondary | ICD-10-CM | POA: Insufficient documentation

## 2020-02-20 DIAGNOSIS — E785 Hyperlipidemia, unspecified: Secondary | ICD-10-CM | POA: Insufficient documentation

## 2020-02-20 DIAGNOSIS — Z833 Family history of diabetes mellitus: Secondary | ICD-10-CM | POA: Insufficient documentation

## 2020-02-20 DIAGNOSIS — Z885 Allergy status to narcotic agent status: Secondary | ICD-10-CM | POA: Diagnosis not present

## 2020-02-20 DIAGNOSIS — J0100 Acute maxillary sinusitis, unspecified: Secondary | ICD-10-CM | POA: Diagnosis not present

## 2020-02-20 DIAGNOSIS — Z91040 Latex allergy status: Secondary | ICD-10-CM | POA: Diagnosis not present

## 2020-02-20 DIAGNOSIS — Z8249 Family history of ischemic heart disease and other diseases of the circulatory system: Secondary | ICD-10-CM | POA: Insufficient documentation

## 2020-02-20 DIAGNOSIS — F17210 Nicotine dependence, cigarettes, uncomplicated: Secondary | ICD-10-CM | POA: Diagnosis not present

## 2020-02-20 DIAGNOSIS — R05 Cough: Secondary | ICD-10-CM | POA: Diagnosis present

## 2020-02-20 LAB — SARS CORONAVIRUS 2 (TAT 6-24 HRS): SARS Coronavirus 2: NEGATIVE

## 2020-02-20 MED ORDER — FLUTICASONE PROPIONATE 50 MCG/ACT NA SUSP: 1.0000 | Freq: Every day | NASAL | 0 refills | Status: AC

## 2020-02-20 MED ORDER — GUAIFENESIN-CODEINE 100-10 MG/5ML PO SOLN: 5.0000 mL | Freq: Three times a day (TID) | ORAL | 0 refills | Status: DC | PRN

## 2020-02-20 MED ORDER — AMOXICILLIN-POT CLAVULANATE 875-125 MG PO TABS: 1.0000 | ORAL_TABLET | Freq: Two times a day (BID) | ORAL | 0 refills | Status: AC

## 2020-02-20 NOTE — ED Provider Notes (Signed)
[place]    CSN: [id number] Arrival date & time: 02/20/20  1032      History   Chief Complaint Chief Complaint  Patient presents with  . Cough  . Nasal Congestion    HPI Donna Simpson is a 59 y.o. female history of hypertension, hyperlipidemia, tobacco use, presenting today for evaluation of sinus congestion pressure.  Patient notes that over the past week she has had rhinorrhea and sinus congestion.  She has had associated headache and fatigue.  Reports dry cough with throat irritation.  Symptoms mainly in head, denies chest pain or shortness of breath.  Denies fevers chills body aches.  Denies any GI upset.  Did have [employer] vaccine on 3/13.  Denies any close sick contacts or known Covid exposure.  Using Claritin, Mucinex and Sudafed without relief.  HPI  Past Medical History:  Diagnosis Date  . Anxiety   . Anxiety   . Atypical chest pain 02/03/2017  . Essential hypertension 02/03/2017  . Hyperlipidemia 02/03/2017  . Hypertension   . Leg cramping 02/03/2017  . Tobacco abuse 03/10/2017    Patient Active Problem List   Diagnosis Date Noted  . Family history of cardiomyopathy 06/05/2019  . Tobacco abuse 03/10/2017  . Essential hypertension 02/03/2017  . Leg cramping 02/03/2017  . Atypical chest pain 02/03/2017    Past Surgical History:  Procedure Laterality Date  . BACK SURGERY     Spinal fusion  . BREAST SURGERY     Biopsy--benign  . FOOT SURGERY    . PELVIC LAPAROSCOPY    . SPINAL FUSION    . VAGINAL HYSTERECTOMY      OB History    Gravida  2   Para  2   Term  2   Preterm      AB      Living  2     SAB      TAB      Ectopic      Multiple      Live Births               Home Medications    Prior to Admission medications   Medication Sig Start Date End Date Taking? Authorizing Provider  DULoxetine (CYMBALTA) 30 MG capsule Take 30 mg by mouth 2 (two) times daily. 12/31/19  Yes [provider]   hydrochlorothiazide (MICROZIDE) 12.5 MG capsule Take 12.5 mg by mouth daily.   Yes [provider]  montelukast (SINGULAIR) 10 MG tablet Take 1 tablet by mouth as needed. 01/02/17  Yes [provider]  amoxicillin-clavulanate (AUGMENTIN) 875-125 MG tablet Take 1 tablet by mouth every 12 (twelve) hours for 7 days. 02/20/20 02/27/20  Wieters, Hallie C, PA-C  Calcium Carbonate-Vitamin D (CALCIUM + D PO) Take by mouth.    [provider]  Cholecalciferol (VITAMIN D PO) Take by mouth.    [provider]  fluticasone (FLONASE) 50 MCG/ACT nasal spray Place 1-2 sprays into both nostrils daily for 7 days. 02/20/20 02/27/20  Wieters, Hallie C, PA-C  guaiFENesin-codeine 100-10 MG/5ML syrup Take 5 mLs by mouth 3 (three) times daily as needed for cough. 02/20/20   Wieters, Hallie C, PA-C  VOLTAREN 1 % GEL Apply 1 application topically as needed. 01/27/17   [provider]    Family History Family History  Problem Relation Age of Onset  . Cancer Mother        Lung  . Hypertension Father   . Heart attack  Father   . Heart failure Father   . Hypertension Sister   . Hypertension Brother   . Diabetes Brother   . Congenital heart disease Brother   . Valvular heart disease Brother   . Heart failure Brother     Social History Social History   Tobacco Use  . Smoking status: Current Every Day Smoker    Packs/day: 0.50    Types: Cigarettes  . Smokeless tobacco: Never Used  Substance Use Topics  . Alcohol use: Yes    Alcohol/week: 0.0 standard drinks    Comment: Rare  . Drug use: No     Allergies   Hydrocodone, Latex, and Zithromax [azithromycin]   Review of Systems Review of Systems  Constitutional: Positive for fatigue. Negative for activity change, appetite change, chills and fever.  HENT: Positive for congestion, ear pain, rhinorrhea, sinus pressure and sore throat. Negative for trouble swallowing.   Eyes: Negative for discharge and redness.   Respiratory: Positive for cough. Negative for chest tightness and shortness of breath.   Cardiovascular: Negative for chest pain.  Gastrointestinal: Negative for abdominal pain, diarrhea, nausea and vomiting.  Musculoskeletal: Negative for myalgias.  Skin: Negative for rash.  Neurological: Positive for headaches. Negative for dizziness and light-headedness.     Physical Exam Triage Vital Signs ED Triage Vitals  Enc Vitals Group     BP 02/20/20 1106 (!) 144/87     Pulse Rate 02/20/20 1106 73     Resp 02/20/20 1106 18     Temp 02/20/20 1106 98.2 F (36.8 C)     Temp Source 02/20/20 1106 Oral     SpO2 02/20/20 1106 100 %     Weight --      Height --      Head Circumference --      Peak Flow --      Pain Score 02/20/20 1101 0     Pain Loc --      Pain Edu? --      Excl. in GC? --    No data found.  Updated Vital Signs BP (!) 144/87 (BP Location: Left Arm)   Pulse 73   Temp 98.2 F (36.8 C) (Oral)   Resp 18   SpO2 100%   Visual Acuity Right Eye Distance:   Left Eye Distance:   Bilateral Distance:    Right Eye Near:   Left Eye Near:    Bilateral Near:     Physical Exam Vitals and nursing note reviewed.  Constitutional:      Appearance: She is well-developed.     Comments: No acute distress  HENT:     Head: Normocephalic and atraumatic.     Comments: Maxillary sinus tenderness    Ears:     Comments: Bilateral ears without tenderness to palpation of external auricle, tragus and mastoid, EAC's without erythema or swelling, TM's with good bony landmarks and cone of light. Non erythematous.     Nose: Nose normal.     Mouth/Throat:     Comments: Oral mucosa pink and moist, no tonsillar enlargement or exudate. Posterior pharynx patent and nonerythematous, no uvula deviation or swelling. Normal phonation.  Eyes:     Conjunctiva/sclera: Conjunctivae normal.  Cardiovascular:     Rate and Rhythm: Normal rate.  Pulmonary:     Effort: Pulmonary effort is normal. No  respiratory distress.     Comments: Breathing comfortably at rest, CTABL, no wheezing, rales or other adventitious sounds auscultated Abdominal:     General:  There is no distension.  Musculoskeletal:        General: Normal range of motion.     Cervical back: Neck supple.  Skin:    General: Skin is warm and dry.  Neurological:     Mental Status: She is alert and oriented to person, place, and time.      UC Treatments / Results  Labs (all labs ordered are listed, but only abnormal results are displayed) Labs Reviewed  SARS CORONAVIRUS 2 (TAT 6-24 HRS)    EKG   Radiology No results found.  Procedures Procedures (including critical care time)  Medications Ordered in UC Medications - No data to display  Initial Impression / Assessment and Plan / UC Course  I have reviewed the triage vital signs and the nursing notes.  Pertinent labs & imaging results that were available during my care of the patient were reviewed by me and considered in my medical decision making (see chart for details).     Covid PCR pending.  URI symptoms x1 week, we will go ahead and treat for sinusitis with Augmentin twice daily x1 week.  Continue symptomatic and supportive care for any underlying allergic rhinitis symptoms.  Providing Robitussin with codeine for cough and throat discomfort.  Advised to use at home, may cause some drowsiness.  Discussed possible cross-reactivity between codeine/hydrocodone as she has itching with hydrocodone.  Rest and push fluids.  Discussed strict return precautions. Patient verbalized understanding and is agreeable with plan.  Final Clinical Impressions(s) / UC Diagnoses   Final diagnoses:  Acute non-recurrent maxillary sinusitis     Discharge Instructions     Covid test pending, monitor my chart for results Begin Augmentin twice daily for the next week, take with food Continue Claritin, Mucinex, may try Flonase as alternative to help open up sinuses  Robitussin with codeine provided, only use at home/bedtime, may cause some drowsiness Rest and drink plenty of fluids Honey and hot tea for throat  Please follow-up if developing any worsening or persistent symptoms   ED Prescriptions    Medication Sig Dispense Auth. Provider   amoxicillin-clavulanate (AUGMENTIN) 875-125 MG tablet Take 1 tablet by mouth every 12 (twelve) hours for 7 days. 14 tablet Wieters, Hallie C, PA-C   guaiFENesin-codeine 100-10 MG/5ML syrup Take 5 mLs by mouth 3 (three) times daily as needed for cough. 100 mL Wieters, Hallie C, PA-C   fluticasone (FLONASE) 50 MCG/ACT nasal spray Place 1-2 sprays into both nostrils daily for 7 days. 1 g Wieters, Kemp C, PA-C     I have reviewed the PDMP during this encounter.   Lew Dawes, [place] 02/20/20 1136

## 2020-02-20 NOTE — ED Triage Notes (Addendum)
Pt c/o sinus congestion, dry throat, HA, fatigue and productive cough for approx 6 days. States has been using Rx claritin, OTC Mucinex, sudafed w/o significant improvement. Right ear pain yesterday. Denies fever, chills, abdom pain, n/v/d, body aches.  Pt states she received [employer] COVID vaccine on January 25, 2020

## 2020-02-20 NOTE — Discharge Instructions (Signed)
Covid test pending, monitor my chart for results Begin Augmentin twice daily for the next week, take with food Continue Claritin, Mucinex, may try Flonase as alternative to help open up sinuses Robitussin with codeine provided, only use at home/bedtime, may cause some drowsiness Rest and drink plenty of fluids Honey and hot tea for throat  Please follow-up if developing any worsening or persistent symptoms

## 2020-02-22 ENCOUNTER — Telehealth (HOSPITAL_COMMUNITY): Payer: Self-pay | Admitting: Urgent Care

## 2020-02-22 MED ORDER — FLUCONAZOLE 150 MG PO TABS: 150.0000 mg | ORAL_TABLET | ORAL | 0 refills | Status: DC

## 2020-02-22 NOTE — Telephone Encounter (Signed)
Patient called requesting prescription for Diflucan for yeast infection for amoxicillin.  Prescription sent for Diflucan, 150 mg once weekly for 2 doses.

## 2020-02-22 NOTE — ED Notes (Signed)
Called pt back about the phone message she left and pt states she is getting a yeast inf from the amoxicillin and requesting diflucan to be sent to pharmacy, I informed the PA [facility], who is going to take care of it.

## 2020-04-02 ENCOUNTER — Other Ambulatory Visit: Payer: Self-pay

## 2020-04-03 ENCOUNTER — Encounter: Payer: Self-pay | Admitting: Obstetrics and Gynecology

## 2020-04-03 ENCOUNTER — Ambulatory Visit (INDEPENDENT_AMBULATORY_CARE_PROVIDER_SITE_OTHER): Payer: Medicaid Other | Admitting: Obstetrics and Gynecology

## 2020-04-03 VITALS — BP 124/76 | Ht 68.0 in | Wt 194.0 lb

## 2020-04-03 DIAGNOSIS — M8580 Other specified disorders of bone density and structure, unspecified site: Secondary | ICD-10-CM

## 2020-04-03 DIAGNOSIS — Z0000 Encounter for general adult medical examination without abnormal findings: Secondary | ICD-10-CM

## 2020-04-03 DIAGNOSIS — Z780 Asymptomatic menopausal state: Secondary | ICD-10-CM

## 2020-04-03 DIAGNOSIS — Z01419 Encounter for gynecological examination (general) (routine) without abnormal findings: Secondary | ICD-10-CM

## 2020-04-03 NOTE — Patient Instructions (Signed)
We will plan to repeat the DEXA/bone density scan this year, please schedule.  Continue weight bearing exercise, and vitamin D/calcium intake.  

## 2020-04-03 NOTE — Addendum Note (Signed)
Addended by: Dayna Barker on: 04/03/2020 12:02 PM   Modules accepted: Orders

## 2020-04-03 NOTE — Progress Notes (Signed)
   Donna Simpson 27-Dec-1960 [id number]  SUBJECTIVE:  59 y.o. G2P2002 female here for an annual routine gynecologic exam and Pap smear. She has no gynecologic concerns.  Current Outpatient Medications  Medication Sig Dispense Refill  . Calcium Carbonate-Vitamin D (CALCIUM + D PO) Take by mouth.    . Cholecalciferol (VITAMIN D PO) Take by mouth.    . DULoxetine (CYMBALTA) 30 MG capsule Take 30 mg by mouth 2 (two) times daily.    . hydrochlorothiazide (MICROZIDE) 12.5 MG capsule Take 12.5 mg by mouth daily.    . montelukast (SINGULAIR) 10 MG tablet Take 1 tablet by mouth as needed.  5  . VOLTAREN 1 % GEL Apply 1 application topically as needed.  2  . fluconazole (DIFLUCAN) 150 MG tablet Take 1 tablet (150 mg total) by mouth once a week. (Patient not taking: Reported on 04/03/2020) 2 tablet 0  . fluticasone (FLONASE) 50 MCG/ACT nasal spray Place 1-2 sprays into both nostrils daily for 7 days. 1 g 0  . guaiFENesin-codeine 100-10 MG/5ML syrup Take 5 mLs by mouth 3 (three) times daily as needed for cough. (Patient not taking: Reported on 04/03/2020) 100 mL 0   No current facility-administered medications for this visit.   Allergies: Hydrocodone, Latex, and Zithromax [azithromycin]  No LMP recorded. Patient has had a hysterectomy.  Past medical history,surgical history, problem list, medications, allergies, family history and social history were all reviewed and documented as reviewed in the EPIC chart.  ROS:  Feeling well. No dyspnea or chest pain on exertion.  No abdominal pain, change in bowel habits, black or bloody stools.  No urinary tract symptoms. GYN ROS:  no abnormal bleeding, pelvic pain or discharge, no breast pain or new or enlarging lumps on self exam. No neurological complaints.   OBJECTIVE:  BP 124/76   Ht 5\' 8"  (1.727 m)   Wt 194 lb (88 kg)   BMI 29.50 kg/m  The patient appears well, alert, oriented x 3, in no distress. ENT normal.  Neck supple. No cervical or  supraclavicular adenopathy or thyromegaly.  Lungs are clear, good air entry, no wheezes, rhonchi or rales. S1 and S2 normal, no murmurs, regular rate and rhythm.  Abdomen soft without tenderness, guarding, mass or organomegaly.  Neurological is normal, no focal findings.  BREAST EXAM: breasts appear normal, no suspicious masses, no skin or nipple changes or axillary nodes  PELVIC EXAM: VULVA: normal appearing vulva with no masses, tenderness or lesions, VAGINA: normal appearing vagina with normal color and discharge, no lesions, CERVIX: surgically absent, UTERUS: surgically absent, vaginal cuff normal, ADNEXA: no masses, PAP: Pap smear done today, thin-prep method  Chaperone: present during the examination  ASSESSMENT:  59 y.o. 59 here for annual gynecologic exam  PLAN:   1. Postmenopausal. Prior TVH.  No significant menopausal symptoms. 2. Pap smear 2018.  Pap smear of vaginal cuff repeated today. 3. Mammogram 10/2019.  Normal breast exam today.  She is reminded to schedule an annual mammogram this year when due. 4. Colonoscopy 2015.  Recommended that she follow up at the recommended interval.   5. Osteopenia.  DEXA 2018 T-score -1.8, FRAX 3% / 0.3%.  Next DEXA recommended now so she plans to schedule this. 6. Health maintenance.  No labs today as she normally has these completed elsewhere.  Return annually or sooner, prn.  2019 MD 04/03/20

## 2020-04-06 LAB — PAP IG W/ RFLX HPV ASCU

## 2020-04-10 ENCOUNTER — Other Ambulatory Visit: Payer: Self-pay

## 2020-04-14 ENCOUNTER — Other Ambulatory Visit: Payer: Self-pay

## 2020-04-14 ENCOUNTER — Other Ambulatory Visit: Payer: Self-pay | Admitting: Obstetrics & Gynecology

## 2020-04-14 ENCOUNTER — Ambulatory Visit (INDEPENDENT_AMBULATORY_CARE_PROVIDER_SITE_OTHER): Payer: Medicaid Other

## 2020-04-14 DIAGNOSIS — M8589 Other specified disorders of bone density and structure, multiple sites: Secondary | ICD-10-CM | POA: Diagnosis not present

## 2020-04-14 DIAGNOSIS — Z780 Asymptomatic menopausal state: Secondary | ICD-10-CM

## 2020-04-14 DIAGNOSIS — Z01419 Encounter for gynecological examination (general) (routine) without abnormal findings: Secondary | ICD-10-CM

## 2020-04-14 DIAGNOSIS — M8580 Other specified disorders of bone density and structure, unspecified site: Secondary | ICD-10-CM

## 2020-06-28 ENCOUNTER — Emergency Department (HOSPITAL_BASED_OUTPATIENT_CLINIC_OR_DEPARTMENT_OTHER)
Admission: EM | Admit: 2020-06-28 | Discharge: 2020-06-28 | Discharge status: Against Medical Advice | Payer: Medicaid Other

## 2020-06-28 NOTE — ED Notes (Signed)
Pt advised registration staff that she changed her mind about wanting to be seen.

## 2020-06-29 ENCOUNTER — Ambulatory Visit (HOSPITAL_COMMUNITY)
Admission: EM | Admit: 2020-06-29 | Discharge: 2020-06-29 | Disposition: A | Discharge status: Home / Self Care | Payer: Medicaid Other | Attending: Family Medicine | Admitting: Family Medicine

## 2020-06-29 ENCOUNTER — Other Ambulatory Visit: Payer: Self-pay

## 2020-06-29 ENCOUNTER — Encounter (HOSPITAL_COMMUNITY): Payer: Self-pay | Admitting: Emergency Medicine

## 2020-06-29 DIAGNOSIS — S161XXA Strain of muscle, fascia and tendon at neck level, initial encounter: Secondary | ICD-10-CM | POA: Diagnosis not present

## 2020-06-29 DIAGNOSIS — S39012A Strain of muscle, fascia and tendon of lower back, initial encounter: Secondary | ICD-10-CM

## 2020-06-29 MED ORDER — KETOROLAC TROMETHAMINE 60 MG/2ML IM SOLN
60.0000 mg | Freq: Once | INTRAMUSCULAR | Status: AC
  Administered 2020-06-29: 60 mg via INTRAMUSCULAR

## 2020-06-29 MED ORDER — TRAMADOL HCL 50 MG PO TABS: 50.0000 mg | ORAL_TABLET | Freq: Three times a day (TID) | ORAL | 0 refills | Status: DC | PRN

## 2020-06-29 MED ORDER — KETOROLAC TROMETHAMINE 60 MG/2ML IM SOLN
INTRAMUSCULAR | Status: AC
  Filled 2020-06-29: qty 2
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
## 2020-06-29 NOTE — Discharge Instructions (Signed)
Light and regular activity as tolerated.  See exercises provided.  Heat application while active can help with muscle spasms.  Sleep with pillow under your knees.   Use of flexeril as needed for spasms, meloxicam daily (don't take today). Tramadol for breakthrough pain. May cause drowsiness. Please do not take if driving or drinking alcohol.  Be mindful of taking in combination with flexeril as well as these can both cause drowsiness.  Please follow up with your primary care provider as you may need further evaluation or long term management/ referral if persistent.

## 2020-07-30 ENCOUNTER — Other Ambulatory Visit: Payer: Self-pay | Admitting: Internal Medicine

## 2020-07-30 DIAGNOSIS — E041 Nontoxic single thyroid nodule: Secondary | ICD-10-CM

## 2020-08-04 ENCOUNTER — Ambulatory Visit (INDEPENDENT_AMBULATORY_CARE_PROVIDER_SITE_OTHER): Payer: Medicaid Other

## 2020-08-04 ENCOUNTER — Other Ambulatory Visit: Payer: Self-pay

## 2020-08-04 ENCOUNTER — Ambulatory Visit: Payer: Medicaid Other | Admitting: Podiatry

## 2020-08-04 DIAGNOSIS — M84374D Stress fracture, right foot, subsequent encounter for fracture with routine healing: Secondary | ICD-10-CM

## 2020-08-04 DIAGNOSIS — M79671 Pain in right foot: Secondary | ICD-10-CM

## 2020-08-04 DIAGNOSIS — M84374A Stress fracture, right foot, initial encounter for fracture: Secondary | ICD-10-CM

## 2020-08-04 DIAGNOSIS — R609 Edema, unspecified: Secondary | ICD-10-CM

## 2020-08-04 NOTE — Patient Instructions (Signed)
If was nice to meet you today. If you have any questions or any further concerns, please feel fee to give me a call. You can call our office at [phone] or please feel fee to send me a message through MyChart.    For instructions on how to put on your Cam Walking Boot, please visit [email or web address]   Stress Fracture  A stress fracture is a small break or crack in a bone. A stress fracture can be fully broken (complete) or partially broken (incomplete). The most common sites for stress fractures are the bones in the front of your feet (metatarsals), your heel (calcaneus), and the long bone of your lower leg (tibia). What are the causes? This condition is caused by overuse or repetitive exercise, such as running. It happens when a bone cannot absorb any more shock because the muscles around it are weak. Stress fractures happen most commonly when:  You rapidly increase or start a new physical activity.  You use shoes that are worn out or do not fit properly.  You exercise on a new surface. What increases the risk? You are more likely to develop this condition if:  You have a condition that causes weak bones (osteoporosis).  You are female. Stress fractures are more likely to occur in women. What are the signs or symptoms? The most common symptom of a stress fracture is feeling pain when you are using or putting weight on the affected part of your body. The pain usually improves when you are resting. Other symptoms may include:  Swelling of the affected area.  Pain in the area when it is touched. Stress fracture pain usually develops over time. How is this diagnosed? This condition may be diagnosed by:  Your symptoms.  Your medical history.  A physical exam.  Imaging tests, such as: ? X-rays. ? MRI. ? Bone scan. How is this treated? Treatment depends on the severity of your stress fracture. It is commonly treated with resting, icing, compression, and elevation  (RICE therapy). Treatment may also include:  Medicines to reduce inflammation.  A cast or a walking shoe.  Crutches.  Surgery. This is usually only in severe cases. Follow these instructions at home: If you have a cast:  Do not put pressure on any part of the cast until it is fully hardened. This may take several hours.  Do not stick anything inside the cast to scratch your skin. Doing that increases your risk of infection.  Check the skin around the cast every day. Tell your health care provider about any concerns.  You may put lotion on dry skin around the edges of the cast. Do not put lotion on the skin underneath the cast.  Keep the cast clean.  If the cast is not waterproof: ? Do not let it get wet. ? Cover it with a watertight covering when you take a bath or a shower. If you have a walking shoe:  Wear the shoe as told by your health care provider. Remove it only as told by your health care provider.  Loosen the shoe if your toes tingle, become numb, or turn cold and blue.  Keep the shoe clean.  If the shoe is not waterproof: ? Do not let it get wet. Managing pain, stiffness, and swelling   If directed, apply ice to the injured area: ? If you have a walking shoe, remove the shoe as told by your health care provider. ? Put ice in a plastic bag. ? Place  a towel between your skin and the bag or between your cast and the bag. ? Leave the ice on for 20 minutes, 2-3 times per day.  Move your toes often to avoid stiffness and to lessen swelling.  Raise (elevate) the injured area above the level of your heart while you are sitting or lying down. Activity  Rest as directed by your health care provider. Ask your health care provider if you may do alternative exercises, such as swimming or biking, while you are healing.  Return to your normal activities as directed by your health care provider. Ask your health care provider what activities are safe for you.  Perform  range-of-motion exercises only as directed by your health care provider. General instructions  Do not use the injured limb to support yourbody weight until your health care provider says that you can. Use crutches if your health care provider tells you to do so.  Do not use any products that contain nicotine or tobacco, such as cigarettes and e-cigarettes. These can delay bone healing. If you need help quitting, ask your health care provider.  Take over-the-counter and prescription medicines only as told by your health care provider.  Keep all follow-up visits as told by your health care provider. This is important. How is this prevented?  Only wear shoes that: ? Fit well. ? Are not worn out.  Eat a healthy diet that contains vitamin D and calcium. This helps keep your bones strong. Good sources of calcium and vitamin D include: ? Low-fat dairy products such as milk, yogurt, and cheese. ? Certain fish, such as fresh or canned salmon, tuna, and sardines. ? Products that have calcium and vitamin D added to them (fortified products), such as fortified cereals or juice.  Be careful when you start a new physical activity. Give your body time to adjust.  Avoid doing only one kind of activity. Do different exercises, such as swimming and running, so that no single part of your body gets overused.  Do strength-training exercises. Contact a health care provider if:  Your pain gets worse.  You have new symptoms.  You have increased swelling. Get help right away if:  You lose feeling in the injured area. Summary  A stress fracture is a small break or crack in a bone. A stress fracture can be fully broken (complete) or partially broken (incomplete).  This condition is caused by overuse or repetitive exercise, such as running.  The most common symptom of a stress fracture is feeling pain when you are using or putting weight on the affected part of your body.  Treatment depends on the  severity of your stress fracture. This information is not intended to replace advice given to you by your health care provider. Make sure you discuss any questions you have with your health care provider. Document Revised: 12/12/2017 Document Reviewed: 12/12/2017 Elsevier Patient Education  2020 [employer].

## 2020-08-04 NOTE — Progress Notes (Signed)
D
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
## 2020-08-06 ENCOUNTER — Ambulatory Visit: Payer: Medicaid Other | Admitting: Podiatry

## 2020-08-14 ENCOUNTER — Ambulatory Visit
Admission: RE | Admit: 2020-08-14 | Discharge: 2020-08-14 | Disposition: A | Discharge status: Home / Self Care | Payer: Medicaid Other | Source: Ambulatory Visit | Attending: Internal Medicine | Admitting: Internal Medicine

## 2020-08-14 DIAGNOSIS — E041 Nontoxic single thyroid nodule: Secondary | ICD-10-CM

## 2020-09-08 ENCOUNTER — Ambulatory Visit (INDEPENDENT_AMBULATORY_CARE_PROVIDER_SITE_OTHER): Payer: Medicaid Other

## 2020-09-08 ENCOUNTER — Ambulatory Visit (INDEPENDENT_AMBULATORY_CARE_PROVIDER_SITE_OTHER): Payer: Medicaid Other | Admitting: Podiatry

## 2020-09-08 ENCOUNTER — Other Ambulatory Visit: Payer: Self-pay

## 2020-09-08 DIAGNOSIS — S9031XD Contusion of right foot, subsequent encounter: Secondary | ICD-10-CM

## 2020-09-08 DIAGNOSIS — M84374D Stress fracture, right foot, subsequent encounter for fracture with routine healing: Secondary | ICD-10-CM | POA: Diagnosis not present

## 2020-09-08 DIAGNOSIS — M79671 Pain in right foot: Secondary | ICD-10-CM | POA: Diagnosis not present

## 2020-09-08 DIAGNOSIS — M7751 Other enthesopathy of right foot: Secondary | ICD-10-CM | POA: Diagnosis not present

## 2020-09-08 DIAGNOSIS — M779 Enthesopathy, unspecified: Secondary | ICD-10-CM | POA: Diagnosis not present

## 2020-09-09 NOTE — Progress Notes (Signed)
Subjective: 59 year old female presents the office today for complaints follow-up evaluation of right foot pain.  She states that she is about 75% better but still having discomfort.  She points to the top of her foot on the second third metatarsals as well as the interspace where she has majority discomfort.  She stopped wearing the boot on last week.  Currently no swelling.  No rating pain to the toes or any weakness.  No numbness or tingling. Denies any systemic complaints such as fevers, chills, nausea, vomiting. No acute changes since last appointment, and no other complaints at this time.   Objective: AAO x3, NAD DP/PT pulses palpable bilaterally, CRT less than 3 seconds Majority of tenderness is on the second interspace of the right foot.  No significant area of pinpoint tenderness identified today.  There is no pain to vibratory sensation of the metatarsals.  Flexor, extensor tendons appear to be intact.  MMT 5/5.  No pain with calf compression, swelling, warmth, erythema  Assessment: Right foot capsulitis, tendinitis  Plan: -All treatment options discussed with the patient including all alternatives, risks, complications.  -Repeat x-ray obtained and reviewed today.  No evidence of acute fracture or stress fracture identified today. -Steroid injection performed to the second interspace.  Verbal consent obtained.  Skin skin with alcohol and mixture of 1 cc Kenalog 10, 0.5 cc Marcaine plain, 0.5 cc lidocaine plain was infiltrated into the second interspace without complications.  Postinjection care discussed. -Dispensed metatarsal pads.  Discussed shoe modifications and orthotics. -Patient encouraged to call the office with any questions, concerns, change in symptoms.   Vivi Barrack DPM

## 2020-09-23 ENCOUNTER — Other Ambulatory Visit: Payer: Self-pay | Admitting: Obstetrics and Gynecology

## 2020-09-23 DIAGNOSIS — Z1231 Encounter for screening mammogram for malignant neoplasm of breast: Secondary | ICD-10-CM

## 2020-09-29 ENCOUNTER — Other Ambulatory Visit: Payer: Self-pay

## 2020-09-29 ENCOUNTER — Ambulatory Visit (INDEPENDENT_AMBULATORY_CARE_PROVIDER_SITE_OTHER): Payer: Medicaid Other

## 2020-09-29 ENCOUNTER — Ambulatory Visit: Payer: Medicaid Other | Admitting: Podiatry

## 2020-09-29 DIAGNOSIS — M79671 Pain in right foot: Secondary | ICD-10-CM | POA: Diagnosis not present

## 2020-09-29 DIAGNOSIS — M84374D Stress fracture, right foot, subsequent encounter for fracture with routine healing: Secondary | ICD-10-CM

## 2020-09-29 DIAGNOSIS — M779 Enthesopathy, unspecified: Secondary | ICD-10-CM

## 2020-09-30 ENCOUNTER — Other Ambulatory Visit: Payer: Self-pay | Admitting: Podiatry

## 2020-09-30 DIAGNOSIS — M84374D Stress fracture, right foot, subsequent encounter for fracture with routine healing: Secondary | ICD-10-CM

## 2020-10-01 ENCOUNTER — Other Ambulatory Visit: Payer: Medicaid Other | Admitting: Orthotics

## 2020-10-01 ENCOUNTER — Telehealth: Payer: Self-pay | Admitting: Podiatry

## 2020-10-05 ENCOUNTER — Telehealth: Payer: Self-pay | Admitting: Podiatry

## 2020-10-05 NOTE — Telephone Encounter (Signed)
MRI has been ordered

## 2020-10-05 NOTE — Addendum Note (Signed)
Addended by: Vivi Barrack on: 10/05/2020 06:34 PM   Modules accepted: Orders

## 2020-10-05 NOTE — Progress Notes (Addendum)
Subjective: 59 year old female presents the office today for follow-up evaluation of right foot pain.  She does feel that she has made significant improvement the foot pain however she still is experiencing discomfort.  Metatarsal pads have been helpful.  She denies recent injury or falls.  No changes otherwise. Denies any systemic complaints such as fevers, chills, nausea, vomiting. No acute changes since last appointment, and no other complaints at this time.   Objective: AAO x3, NAD DP/PT pulses palpable bilaterally, CRT less than 3 seconds There is no significant edema, erythema identified to the foot.  Again the majority of tenderness distal to the second interspace of the right foot with mild to the second third metatarsals.  There is no pain to vibratory sensation.  There is no other areas of discomfort.  MMT 5/5. No pain with calf compression, swelling, warmth, erythema  Assessment: Right foot capsulitis, tendinitis  Plan: -All treatment options discussed with the patient including all alternatives, risks, complications.  -Repeat x-rays were obtained and reviewed today.  No evidence of acute fracture or stress fracture identified today. -Although she is made progress and x-rays been negative will order an MRI given continued discomfort.  X-rays were negative for stress fracture but follow-up for the stress fracture with MRI versus any tendon pathology. -Overall she has been making progress postop in some discomfort.  Dispensed metatarsal pads for her.  Discussed shoe efficacious and orthotics with metatarsal pad.  We held off on another steroid injection today.  Ice to the area daily. -Patient encouraged to call the office with any questions, concerns, change in symptoms.   Vivi Barrack DPM

## 2020-10-05 NOTE — Telephone Encounter (Signed)
Patient called in stating she was suppose to have study completed with [facility], no orders have been placed. Patient stated information was explained and given at last appointment but hasn't been contacted to schedule. Please advise

## 2020-10-09 ENCOUNTER — Encounter (HOSPITAL_COMMUNITY): Payer: Self-pay

## 2020-10-09 ENCOUNTER — Ambulatory Visit (HOSPITAL_COMMUNITY)
Admission: EM | Admit: 2020-10-09 | Discharge: 2020-10-09 | Disposition: A | Discharge status: Home / Self Care | Payer: Medicaid Other | Attending: Family Medicine | Admitting: Family Medicine

## 2020-10-09 ENCOUNTER — Other Ambulatory Visit: Payer: Self-pay

## 2020-10-09 DIAGNOSIS — J019 Acute sinusitis, unspecified: Secondary | ICD-10-CM

## 2020-10-09 MED ORDER — AMOXICILLIN-POT CLAVULANATE 875-125 MG PO TABS: 1.0000 | ORAL_TABLET | Freq: Two times a day (BID) | ORAL | 0 refills | Status: AC

## 2020-10-09 MED ORDER — FLUCONAZOLE 150 MG PO TABS: 150.0000 mg | ORAL_TABLET | Freq: Once | ORAL | 0 refills | Status: AC

## 2020-10-09 MED ORDER — BENZONATATE 200 MG PO CAPS: 200.0000 mg | ORAL_CAPSULE | Freq: Three times a day (TID) | ORAL | 0 refills | Status: AC | PRN

## 2020-10-09 MED ORDER — GUAIFENESIN-CODEINE 100-10 MG/5ML PO SOLN
5.0000 mL | Freq: Four times a day (QID) | ORAL | 0 refills | Status: DC | PRN
Start: 2020-10-09 — End: 2021-04-14

## 2020-10-09 NOTE — ED Provider Notes (Signed)
[facility]    CSN: [id number] Arrival date & time: 10/09/20  4259      History   Chief Complaint Chief Complaint  Patient presents with  . Cough    x 1 week  . Facial Pain    pressure x 1 week  . Sore Throat    x 1 week    HPI Donna Simpson is a 59 y.o. female history of hypertension, presenting today for evaluation of sinus pressure congestion and cough.  Reports over the past week she has had a lot of pressure and congestion.  Cough has been productive, mucus has began changing from clear to color to yellow color.  Using over-the-counter Mucinex and Alka-Seltzer without relief.  Had prior Covid testing at earlier date during course of symptoms which was negative.   HPI  Past Medical History:  Diagnosis Date  . Anxiety   . Anxiety   . Atypical chest pain 02/03/2017  . Essential hypertension 02/03/2017  . Hyperlipidemia 02/03/2017  . Hypertension   . Leg cramping 02/03/2017  . Tobacco abuse 03/10/2017    Patient Active Problem List   Diagnosis Date Noted  . Family history of cardiomyopathy 06/05/2019  . Tobacco abuse 03/10/2017  . Essential hypertension 02/03/2017  . Leg cramping 02/03/2017  . Atypical chest pain 02/03/2017    Past Surgical History:  Procedure Laterality Date  . BACK SURGERY     Spinal fusion  . BREAST SURGERY     Biopsy--benign  . FOOT SURGERY    . PELVIC LAPAROSCOPY    . SPINAL FUSION    . VAGINAL HYSTERECTOMY      OB History    Gravida  2   Para  2   Term  2   Preterm      AB      Living  2     SAB      TAB      Ectopic      Multiple      Live Births               Home Medications    Prior to Admission medications   Medication Sig Start Date End Date Taking? Authorizing Provider  Calcium Carbonate-Vitamin D (CALCIUM + D PO) Take by mouth.   Yes [provider]  hydrochlorothiazide (MICROZIDE) 12.5 MG capsule Take 12.5 mg by mouth daily.   Yes [provider]  ipratropium  (ATROVENT) 0.03 % nasal spray SMARTSIG:1-2 Spray(s) Both Nares 1 to 2 Times Daily 04/06/20  Yes [provider]  LORazepam (ATIVAN) 0.5 MG tablet SMARTSIG:1 Tablet(s) By Mouth 3 Times a Week PRN 06/23/20  Yes [provider]  montelukast (SINGULAIR) 10 MG tablet Take 1 tablet by mouth as needed. 01/02/17  Yes [provider]  traMADol (ULTRAM) 50 MG tablet Take 1 tablet (50 mg total) by mouth every 8 (eight) hours as needed. 06/29/20  Yes Burky, Dorene Grebe B, NP  VOLTAREN 1 % GEL Apply 1 application topically as needed. 01/27/17  Yes [provider]  amoxicillin-clavulanate (AUGMENTIN) 875-125 MG tablet Take 1 tablet by mouth every 12 (twelve) hours for 7 days. 10/09/20 10/16/20  Pa Tennant C, PA-C  benzonatate (TESSALON) 200 MG capsule Take 1 capsule (200 mg total) by mouth 3 (three) times daily as needed for up to 7 days for cough (daytime). 10/09/20 10/16/20  Ara Mano C, PA-C  fluconazole (DIFLUCAN) 150 MG tablet Take 1 tablet (150 mg total) by mouth once for  1 dose. 10/09/20 10/09/20  Jerriann Schrom C, PA-C  fluticasone (FLONASE) 50 MCG/ACT nasal spray Place 1-2 sprays into both nostrils daily for 7 days. 02/20/20 02/27/20  Denali Sharma C, PA-C  guaiFENesin-codeine 100-10 MG/5ML syrup Take 5 mLs by mouth every 6 (six) hours as needed for cough (nightime). 10/09/20   Laddie Math, [name], PA-C    Family History Family History  Problem Relation Age of Onset  . Cancer Mother        Lung  . Hypertension Father   . Heart attack Father   . Heart failure Father   . Hypertension Sister   . Hypertension Brother   . Diabetes Brother   . Congenital heart disease Brother   . Valvular heart disease Brother   . Heart failure Brother     Social History Social History   Tobacco Use  . Smoking status: Current Every Day Smoker    Packs/day: 0.50    Types: Cigarettes  . Smokeless tobacco: Never Used  Vaping Use  . Vaping Use: Never used  Substance Use Topics   . Alcohol use: Yes    Alcohol/week: 0.0 standard drinks    Comment: Rare  . Drug use: No     Allergies   Latex   Review of Systems Review of Systems  Constitutional: Negative for activity change, appetite change, chills, fatigue and fever.  HENT: Positive for congestion, rhinorrhea, sinus pressure and sore throat. Negative for ear pain and trouble swallowing.   Eyes: Negative for discharge and redness.  Respiratory: Positive for cough. Negative for chest tightness and shortness of breath.   Cardiovascular: Negative for chest pain.  Gastrointestinal: Negative for abdominal pain, diarrhea, nausea and vomiting.  Musculoskeletal: Negative for myalgias.  Skin: Negative for rash.  Neurological: Negative for dizziness, light-headedness and headaches.     Physical Exam Triage Vital Signs ED Triage Vitals  Enc Vitals Group     BP 10/09/20 1011 (!) 148/61     Pulse Rate 10/09/20 1011 71     Resp 10/09/20 1011 18     Temp 10/09/20 1011 97.9 F (36.6 C)     Temp Source 10/09/20 1011 Oral     SpO2 10/09/20 1011 100 %     Weight --      Height --      Head Circumference --      Peak Flow --      Pain Score 10/09/20 1013 9     Pain Loc --      Pain Edu? --      Excl. in GC? --    No data found.  Updated Vital Signs BP (!) 148/61 (BP Location: Right Arm)   Pulse 71   Temp 97.9 F (36.6 C) (Oral)   Resp 18   SpO2 100%   Visual Acuity Right Eye Distance:   Left Eye Distance:   Bilateral Distance:    Right Eye Near:   Left Eye Near:    Bilateral Near:     Physical Exam Vitals and nursing note reviewed.  Constitutional:      Appearance: She is well-developed.     Comments: No acute distress  HENT:     Head: Normocephalic and atraumatic.     Ears:     Comments: Bilateral ears without tenderness to palpation of external auricle, tragus and mastoid, EAC's without erythema or swelling, TM's with good bony landmarks and cone of light. Non erythematous.     Nose:  Nose normal.  Mouth/Throat:     Comments: Oral mucosa pink and moist, no tonsillar enlargement or exudate. Posterior pharynx patent and nonerythematous, no uvula deviation or swelling. Normal phonation. Eyes:     Conjunctiva/sclera: Conjunctivae normal.  Cardiovascular:     Rate and Rhythm: Normal rate.  Pulmonary:     Effort: Pulmonary effort is normal. No respiratory distress.     Comments: Breathing comfortably at rest, CTABL, no wheezing, rales or other adventitious sounds auscultated Abdominal:     General: There is no distension.  Musculoskeletal:        General: Normal range of motion.     Cervical back: Neck supple.  Skin:    General: Skin is warm and dry.  Neurological:     Mental Status: She is alert and oriented to person, place, and time.      UC Treatments / Results  Labs (all labs ordered are listed, but only abnormal results are displayed) Labs Reviewed - No data to display  EKG   Radiology No results found.  Procedures Procedures (including critical care time)  Medications Ordered in UC Medications - No data to display  Initial Impression / Assessment and Plan / UC Course  I have reviewed the triage vital signs and the nursing notes.  Pertinent labs & imaging results that were available during my care of the patient were reviewed by me and considered in my medical decision making (see chart for details).     URI symptoms greater than 1 week, treating for sinusitis with Augmentin, continue symptomatic and supportive care of cough and congestion.  Tessalon for daytime cough, Robitussin-AC for nighttime cough.  Discussed strict return precautions. Patient verbalized understanding and is agreeable with plan.  Final Clinical Impressions(s) / UC Diagnoses   Final diagnoses:  Acute sinusitis with symptoms > 10 days     Discharge Instructions     Begin Augmentin twice daily for 1 week Tessalon for cough during the day, cough syrup for bedtime,  does have codeine, may cause drowsiness  Rest and fluids Follow up if not improving or worsening     ED Prescriptions    Medication Sig Dispense Auth. Provider   amoxicillin-clavulanate (AUGMENTIN) 875-125 MG tablet Take 1 tablet by mouth every 12 (twelve) hours for 7 days. 14 tablet Christell Steinmiller C, PA-C   benzonatate (TESSALON) 200 MG capsule Take 1 capsule (200 mg total) by mouth 3 (three) times daily as needed for up to 7 days for cough (daytime). 28 capsule Dodi Leu C, PA-C   guaiFENesin-codeine 100-10 MG/5ML syrup Take 5 mLs by mouth every 6 (six) hours as needed for cough (nightime). 100 mL Tiphani Mells C, PA-C   fluconazole (DIFLUCAN) 150 MG tablet Take 1 tablet (150 mg total) by mouth once for 1 dose. 2 tablet Douglas Rooks, [place] C, PA-C     I have reviewed the PDMP during this encounter.   Lew Dawes, PA-C 10/09/20 1134

## 2020-10-09 NOTE — ED Triage Notes (Signed)
Patient states she has had a cough, sinus pressure, and sore throat x 1 week. Pt is aox4 and ambulatory.

## 2020-10-09 NOTE — Discharge Instructions (Signed)
Begin Augmentin twice daily for 1 week Tessalon for cough during the day, cough syrup for bedtime, does have codeine, may cause drowsiness  Rest and fluids Follow up if not improving or worsening

## 2020-10-09 NOTE — ED Triage Notes (Signed)
Pt declines Covid swab; states had negative [facility] test approx 1 wk ago after current sxs started.

## 2020-10-15 ENCOUNTER — Other Ambulatory Visit: Payer: Self-pay | Admitting: Podiatry

## 2020-10-20 ENCOUNTER — Ambulatory Visit
Admission: RE | Admit: 2020-10-20 | Discharge: 2020-10-20 | Disposition: A | Discharge status: Home / Self Care | Payer: Medicaid Other | Source: Ambulatory Visit | Attending: Podiatry | Admitting: Podiatry

## 2020-10-20 ENCOUNTER — Other Ambulatory Visit: Payer: Self-pay

## 2020-10-20 DIAGNOSIS — M779 Enthesopathy, unspecified: Secondary | ICD-10-CM

## 2020-10-20 DIAGNOSIS — M84374D Stress fracture, right foot, subsequent encounter for fracture with routine healing: Secondary | ICD-10-CM

## 2020-11-09 ENCOUNTER — Ambulatory Visit: Payer: Medicaid Other | Admitting: Podiatry

## 2020-11-09 ENCOUNTER — Other Ambulatory Visit: Payer: Self-pay

## 2020-11-09 DIAGNOSIS — M79671 Pain in right foot: Secondary | ICD-10-CM

## 2020-11-09 DIAGNOSIS — M84374D Stress fracture, right foot, subsequent encounter for fracture with routine healing: Secondary | ICD-10-CM | POA: Diagnosis not present

## 2020-11-10 ENCOUNTER — Ambulatory Visit
Admission: RE | Admit: 2020-11-10 | Discharge: 2020-11-10 | Disposition: A | Discharge status: Home / Self Care | Payer: Medicaid Other | Source: Ambulatory Visit | Attending: Obstetrics and Gynecology | Admitting: Obstetrics and Gynecology

## 2020-11-10 DIAGNOSIS — Z1231 Encounter for screening mammogram for malignant neoplasm of breast: Secondary | ICD-10-CM

## 2020-11-12 NOTE — Progress Notes (Signed)
Subjective: 59 year old female presents the office today for follow-up evaluation after undergoing MRI.  She says that overall she is doing better and she still feels a "pulling" sensation to the foot.  No significant swelling.  No recent injury or falls or changes otherwise. Denies any systemic complaints such as fevers, chills, nausea, vomiting. No acute changes since last appointment, and no other complaints at this time.   Objective: AAO x3, NAD DP/PT pulses palpable bilaterally, CRT less than 3 seconds There is no significant edema, erythema to the foot.  There is still tenderness along third metatarsal today.  There is no other areas of pinpoint tenderness.  No pain to vibratory sensation. No open lesions or pre-ulcerative lesions.  No pain with calf compression, swelling, warmth, erythema  Assessment: Stress reaction, subchondral fracture  Plan: -All treatment options discussed with the patient including all alternatives, risks, complications.  -I reviewed the MRI with her.  Overall her symptoms are improving.  I discussed wearing stiffer soled shoe.  Graphite insert for her shoe.  Anti-inflammatories as needed. -Patient encouraged to call the office with any questions, concerns, change in symptoms.   Vivi Barrack DPM

## 2020-12-22 ENCOUNTER — Ambulatory Visit (INDEPENDENT_AMBULATORY_CARE_PROVIDER_SITE_OTHER): Payer: Medicaid Other

## 2020-12-22 ENCOUNTER — Other Ambulatory Visit: Payer: Self-pay

## 2020-12-22 ENCOUNTER — Ambulatory Visit (INDEPENDENT_AMBULATORY_CARE_PROVIDER_SITE_OTHER): Payer: Medicaid Other | Admitting: Podiatry

## 2020-12-22 DIAGNOSIS — M84374D Stress fracture, right foot, subsequent encounter for fracture with routine healing: Secondary | ICD-10-CM

## 2020-12-22 DIAGNOSIS — M79671 Pain in right foot: Secondary | ICD-10-CM | POA: Diagnosis not present

## 2020-12-23 NOTE — Progress Notes (Signed)
Subjective: 59 year old female presents the office today for follow-up evaluation of right foot pain.  She says overall she is doing much better she not had any pain and no swelling.  She is wearing regular shoe.  The graphite insert was not comfortable will cause her arch to cramp.  No other injuries or changes since I last saw her no new concerns.  Objective: AAO x3, NAD DP/PT pulses palpable bilaterally, CRT less than 3 seconds RIGHT foot: Not able to elicit any area of pinpoint tenderness there is no edema, erythema.  Flexor, extensor tendons appear to be intact.  MMT 5/5.  No pain today.  She started doing daily activities with insignificant discomfort. No open lesions or pre-ulcerative lesions.  No pain with calf compression, swelling, warmth, erythema  Assessment: Stress reaction, subchondral fracture-partially resolved  Plan: -All treatment options discussed with the patient including all alternatives, risks, complications.  -Repeat x-rays obtained reviewed.  No evidence of acute fracture or stress fracture identified today. -Overall doing well and having no pain.  Back to doing regular activities however holding off on high-impact activity still.  Continue with normal low impact activities and now she feels better she can get back to her normal activities.  This time I will see her back as needed as she is doing well but encouraged to call any questions or concerns or any changes in the meantime.  Vivi Barrack DPM

## 2021-01-11 ENCOUNTER — Ambulatory Visit (INDEPENDENT_AMBULATORY_CARE_PROVIDER_SITE_OTHER): Payer: Medicaid Other

## 2021-01-11 ENCOUNTER — Other Ambulatory Visit: Payer: Self-pay

## 2021-01-11 ENCOUNTER — Ambulatory Visit: Payer: Medicaid Other | Admitting: Podiatry

## 2021-01-11 DIAGNOSIS — S92521A Displaced fracture of medial phalanx of right lesser toe(s), initial encounter for closed fracture: Secondary | ICD-10-CM

## 2021-01-11 DIAGNOSIS — M84374D Stress fracture, right foot, subsequent encounter for fracture with routine healing: Secondary | ICD-10-CM

## 2021-01-11 NOTE — Progress Notes (Signed)
Subjective: 60 year old female presents the office today for evaluation of positive fracture of the right second toe.  She states that 2 weeks ago she hit this on the door and since then she has had continued swelling discomfort.  Due to the discomfort continuing she presents today for further evaluation.  She is on no recent treatment.  Otherwise she has been doing well with any issues. Denies any systemic complaints such as fevers, chills, nausea, vomiting. No acute changes since last appointment, and no other complaints at this time.   Objective: AAO x3, NAD DP/PT pulses palpable bilaterally, CRT less than 3 seconds There is edema localized to the right second toe with tenderness palpation to the digit.  No pain to the metatarsal other digits.  Toes in rectus position.  No other areas of discomfort identified today. No pain with calf compression, swelling, warmth, erythema  Assessment: 60 year old female right second toe fracture  Plan: -All treatment options discussed with the patient including all alternatives, risks, complications.  -X-rays obtained reviewed.  Fracture noted to the middle phalanx of the second toe with minimal displacement. -I showed her how to tape the toe today.  I discussed with her surgical shoe.  She has a boot at home and she is going to purchase a surgical shoe. -Patient encouraged to call the office with any questions, concerns, change in symptoms.   Return in about 4 weeks (around 02/08/2021). X-ray right foot   Vivi Barrack DPM

## 2021-01-22 ENCOUNTER — Ambulatory Visit (HOSPITAL_COMMUNITY)
Admission: EM | Admit: 2021-01-22 | Discharge: 2021-01-22 | Disposition: A | Discharge status: Home / Self Care | Payer: Medicaid Other | Attending: Internal Medicine | Admitting: Internal Medicine

## 2021-01-22 ENCOUNTER — Other Ambulatory Visit: Payer: Self-pay

## 2021-01-22 ENCOUNTER — Encounter (HOSPITAL_COMMUNITY): Payer: Self-pay

## 2021-01-22 DIAGNOSIS — R350 Frequency of micturition: Secondary | ICD-10-CM

## 2021-01-22 DIAGNOSIS — R319 Hematuria, unspecified: Secondary | ICD-10-CM

## 2021-01-22 LAB — POCT URINALYSIS DIPSTICK, ED / UC
Bilirubin Urine: NEGATIVE
Glucose, UA: NEGATIVE mg/dL
Ketones, ur: NEGATIVE mg/dL
Leukocytes,Ua: NEGATIVE
Nitrite: NEGATIVE
Protein, ur: NEGATIVE mg/dL
Specific Gravity, Urine: <=1.005 (ref 1.005–1.030)
Urobilinogen, UA: 0.2 mg/dL (ref 0.0–1.0)
pH: 5.5 (ref 5.0–8.0)

## 2021-01-22 MED ORDER — TAMSULOSIN HCL 0.4 MG PO CAPS: 0.4000 mg | ORAL_CAPSULE | Freq: Every day | ORAL | 0 refills | Status: DC

## 2021-01-22 MED ORDER — SULFAMETHOXAZOLE-TRIMETHOPRIM 800-160 MG PO TABS: 1.0000 | ORAL_TABLET | Freq: Two times a day (BID) | ORAL | 0 refills | Status: DC

## 2021-01-22 NOTE — ED Triage Notes (Signed)
Pt in with c/o urinary frequency and hematuria that she noticed yesterday.  States that she was also having abdominal pain

## 2021-01-22 NOTE — ED Provider Notes (Signed)
[facility]    CSN: [id number] Arrival date & time: 01/22/21  1116      History   Chief Complaint Chief Complaint  Patient presents with  . Urinary Frequency  . Hematuria    HPI Donna Simpson is a 60 y.o. female.   Patient here with 1 day history of sudden onset urinary frequency, gross hematuria, suprapubic tenderness, left flank pain.  She denies nausea, vomiting, dysuria, vaginal discharge, fever, chills.  Has tried Tylenol and drinking more fluids with minimal relief.  No history of similar incidences.      Past Medical History:  Diagnosis Date  . Anxiety   . Anxiety   . Atypical chest pain 02/03/2017  . Essential hypertension 02/03/2017  . Hyperlipidemia 02/03/2017  . Hypertension   . Leg cramping 02/03/2017  . Tobacco abuse 03/10/2017    Patient Active Problem List   Diagnosis Date Noted  . Family history of cardiomyopathy 06/05/2019  . Tobacco abuse 03/10/2017  . Essential hypertension 02/03/2017  . Leg cramping 02/03/2017  . Atypical chest pain 02/03/2017    Past Surgical History:  Procedure Laterality Date  . BACK SURGERY     Spinal fusion  . BREAST SURGERY     Biopsy--benign  . FOOT SURGERY    . PELVIC LAPAROSCOPY    . SPINAL FUSION    . VAGINAL HYSTERECTOMY      OB History    Gravida  2   Para  2   Term  2   Preterm      AB      Living  2     SAB      IAB      Ectopic      Multiple      Live Births               Home Medications    Prior to Admission medications   Medication Sig Start Date End Date Taking? Authorizing Provider  sulfamethoxazole-trimethoprim (BACTRIM DS) 800-160 MG tablet Take 1 tablet by mouth 2 (two) times daily. 01/22/21  Yes Particia Nearing, PA-C  tamsulosin (FLOMAX) 0.4 MG CAPS capsule Take 1 capsule (0.4 mg total) by mouth daily. 01/22/21  Yes Particia Nearing, PA-C  Calcium Carbonate-Vitamin D (CALCIUM + D PO) Take by mouth.    [provider]  ergocalciferol  (VITAMIN D2) 1.25 MG (50000 UT) capsule 1 capsule    [provider]  fluconazole (DIFLUCAN) 150 MG tablet Take by mouth. 10/09/20   [provider]  fluticasone (FLONASE) 50 MCG/ACT nasal spray Place 1-2 sprays into both nostrils daily for 7 days. 02/20/20 02/27/20  Wieters, Hallie C, PA-C  guaiFENesin-codeine 100-10 MG/5ML syrup Take 5 mLs by mouth every 6 (six) hours as needed for cough (nightime). 10/09/20   Wieters, Hallie C, PA-C  hydrochlorothiazide (MICROZIDE) 12.5 MG capsule Take 12.5 mg by mouth daily.    [provider]  ipratropium (ATROVENT) 0.03 % nasal spray SMARTSIG:1-2 Spray(s) Both Nares 1 to 2 Times Daily 04/06/20   [provider]  LORazepam (ATIVAN) 0.5 MG tablet SMARTSIG:1 Tablet(s) By Mouth 3 Times a Week PRN 06/23/20   [provider]  methocarbamol (ROBAXIN) 750 MG tablet Take 750 mg by mouth 3 (three) times daily as needed. 10/09/20   [provider]  montelukast (SINGULAIR) 10 MG tablet Take 1 tablet by mouth as needed. 01/02/17   [provider]  traMADol (ULTRAM) 50 MG tablet Take 1 tablet (50 mg  total) by mouth every 8 (eight) hours as needed. 06/29/20   Georgetta Haber, NP  Vitamin D, Ergocalciferol, (DRISDOL) 1.25 MG (50000 UNIT) CAPS capsule Take 50,000 Units by mouth once a week. 12/04/20   [provider]  VOLTAREN 1 % GEL Apply 1 application topically as needed. 01/27/17   [provider]    Family History Family History  Problem Relation Age of Onset  . Cancer Mother        Lung  . Hypertension Father   . Heart attack Father   . Heart failure Father   . Hypertension Sister   . Hypertension Brother   . Diabetes Brother   . Congenital heart disease Brother   . Valvular heart disease Brother   . Heart failure Brother     Social History Social History   Tobacco Use  . Smoking status: Current Every Day Smoker    Packs/day: 0.50    Types: Cigarettes  . Smokeless tobacco: Never  Used  Vaping Use  . Vaping Use: Never used  Substance Use Topics  . Alcohol use: Yes    Alcohol/week: 0.0 standard drinks    Comment: Rare  . Drug use: No     Allergies   Latex   Review of Systems Review of Systems Per HPI Physical Exam Triage Vital Signs ED Triage Vitals  Enc Vitals Group     BP 01/22/21 1149 129/85     Pulse Rate 01/22/21 1149 77     Resp 01/22/21 1149 19     Temp 01/22/21 1149 98 F (36.7 C)     Temp src --      SpO2 01/22/21 1149 100 %     Weight --      Height --      Head Circumference --      Peak Flow --      Pain Score 01/22/21 1147 6     Pain Loc --      Pain Edu? --      Excl. in GC? --    No data found.  Updated Vital Signs BP 129/85   Pulse 77   Temp 98 F (36.7 C)   Resp 19   SpO2 100%   Visual Acuity Right Eye Distance:   Left Eye Distance:   Bilateral Distance:    Right Eye Near:   Left Eye Near:    Bilateral Near:     Physical Exam Vitals and nursing note reviewed.  Constitutional:      Appearance: Normal appearance. She is not ill-appearing.  HENT:     Head: Atraumatic.     Mouth/Throat:     Mouth: Mucous membranes are moist.     Pharynx: Oropharynx is clear.  Eyes:     Extraocular Movements: Extraocular movements intact.     Conjunctiva/sclera: Conjunctivae normal.  Cardiovascular:     Rate and Rhythm: Normal rate and regular rhythm.     Heart sounds: Normal heart sounds.  Pulmonary:     Effort: Pulmonary effort is normal.     Breath sounds: Normal breath sounds.  Abdominal:     General: Bowel sounds are normal. There is no distension.     Palpations: Abdomen is soft.     Tenderness: There is no abdominal tenderness. There is left CVA tenderness. There is no right CVA tenderness or guarding.  Musculoskeletal:        General: No tenderness or signs of injury. Normal range of motion.  Cervical back: Normal range of motion and neck supple.  Skin:    General: Skin is warm and dry.  Neurological:      Mental Status: She is alert and oriented to person, place, and time.  Psychiatric:        Mood and Affect: Mood normal.        Thought Content: Thought content normal.        Judgment: Judgment normal.      UC Treatments / Results  Labs (all labs ordered are listed, but only abnormal results are displayed) Labs Reviewed  POCT URINALYSIS DIPSTICK, ED / UC - Abnormal; Notable for the following components:      Result Value   Hgb urine dipstick MODERATE (*)    All other components within normal limits  URINE CULTURE    EKG   Radiology No results found.  Procedures Procedures (including critical care time)  Medications Ordered in UC Medications - No data to display  Initial Impression / Assessment and Plan / UC Course  I have reviewed the triage vital signs and the nursing notes.  Pertinent labs & imaging results that were available during my care of the patient were reviewed by me and considered in my medical decision making (see chart for details).     UA without evidence of urinary tract infection, though given her symptoms will send out for urine culture and start Bactrim while awaiting this just in case the sample given was diluted with significant water intake today.  High suspicion for kidney stone given the constellation of symptoms and the hemoglobin on UA today so we will start Flomax and ibuprofen as needed, but discussed close PCP or urology follow-up for recheck in the next few days.  ED if symptoms significantly worsening in meantime.  Final Clinical Impressions(s) / UC Diagnoses   Final diagnoses:  Hematuria, unspecified type  Urinary frequency   Discharge Instructions   None    ED Prescriptions    Medication Sig Dispense Auth. Provider   sulfamethoxazole-trimethoprim (BACTRIM DS) 800-160 MG tablet Take 1 tablet by mouth 2 (two) times daily. 6 tablet Particia Nearing, [place]   tamsulosin (FLOMAX) 0.4 MG CAPS capsule Take 1 capsule (0.4 mg total)  by mouth daily. 14 capsule Particia Nearing, [place]     PDMP not reviewed this encounter.   Particia Nearing, [place] 01/22/21 1637

## 2021-01-25 LAB — URINE CULTURE: Culture: 20000 — AB

## 2021-04-05 ENCOUNTER — Ambulatory Visit (HOSPITAL_COMMUNITY)
Admission: EM | Admit: 2021-04-05 | Discharge: 2021-04-05 | Disposition: A | Discharge status: Home / Self Care | Payer: Medicaid Other | Attending: Internal Medicine | Admitting: Internal Medicine

## 2021-04-05 ENCOUNTER — Encounter (HOSPITAL_COMMUNITY): Payer: Self-pay

## 2021-04-05 ENCOUNTER — Other Ambulatory Visit: Payer: Self-pay

## 2021-04-05 DIAGNOSIS — N10 Acute tubulo-interstitial nephritis: Secondary | ICD-10-CM | POA: Diagnosis present

## 2021-04-05 LAB — POCT URINALYSIS DIPSTICK, ED / UC
Bilirubin Urine: NEGATIVE
Glucose, UA: NEGATIVE mg/dL
Ketones, ur: NEGATIVE mg/dL
Nitrite: NEGATIVE
Protein, ur: NEGATIVE mg/dL
Specific Gravity, Urine: <=1.005 (ref 1.005–1.030)
Urobilinogen, UA: 0.2 mg/dL (ref 0.0–1.0)
pH: 7 (ref 5.0–8.0)

## 2021-04-05 MED ORDER — SULFAMETHOXAZOLE-TRIMETHOPRIM 800-160 MG PO TABS: 1.0000 | ORAL_TABLET | Freq: Two times a day (BID) | ORAL | 0 refills | Status: AC

## 2021-04-05 NOTE — Discharge Instructions (Signed)
Please take medications as prescribed If you have worsening symptoms please go to the urgent care to be reevaluated We will call you with recommendations if labs are abnormal.

## 2021-04-05 NOTE — ED Provider Notes (Signed)
[facility]    CSN: [id number] Arrival date & time: 04/05/21  7048      History   Chief Complaint Chief Complaint  Patient presents with  . Hematuria    HPI Donna Simpson is a 60 y.o. female with a history of urinary tract infection comes to the urgent care with 1 week history of lower abdominal pain, left flank pain and blood in urine.  Symptoms started about a week ago.  Patient was seen by urologist sometime last week and underwent cystoscopy with ureteroscopy.  Patient was seen previously for presumed renal stone.  CT scan was done during the your office visit-results are currently pending.  Patient denies any nausea, vomiting or diarrhea.  No fever or chills.  She has urgency but denies frequency or dysuria.  No vaginal discharge or irritation.Marland Kitchen   HPI  Past Medical History:  Diagnosis Date  . Anxiety   . Anxiety   . Atypical chest pain 02/03/2017  . Essential hypertension 02/03/2017  . Hyperlipidemia 02/03/2017  . Hypertension   . Leg cramping 02/03/2017  . Tobacco abuse 03/10/2017    Patient Active Problem List   Diagnosis Date Noted  . Family history of cardiomyopathy 06/05/2019  . Tobacco abuse 03/10/2017  . Essential hypertension 02/03/2017  . Leg cramping 02/03/2017  . Atypical chest pain 02/03/2017    Past Surgical History:  Procedure Laterality Date  . BACK SURGERY     Spinal fusion  . BREAST SURGERY     Biopsy--benign  . FOOT SURGERY    . PELVIC LAPAROSCOPY    . SPINAL FUSION    . VAGINAL HYSTERECTOMY      OB History    Gravida  2   Para  2   Term  2   Preterm      AB      Living  2     SAB      IAB      Ectopic      Multiple      Live Births               Home Medications    Prior to Admission medications   Medication Sig Start Date End Date Taking? Authorizing Provider  sulfamethoxazole-trimethoprim (BACTRIM DS) 800-160 MG tablet Take 1 tablet by mouth 2 (two) times daily for 7 days. 04/05/21 04/12/21 Yes  Kirtan Sada, Britta Mccreedy, MD  Calcium Carbonate-Vitamin D (CALCIUM + D PO) Take by mouth.    [provider]  ergocalciferol (VITAMIN D2) 1.25 MG (50000 UT) capsule 1 capsule    [provider]  fluconazole (DIFLUCAN) 150 MG tablet Take by mouth. 10/09/20   [provider]  fluticasone (FLONASE) 50 MCG/ACT nasal spray Place 1-2 sprays into both nostrils daily for 7 days. 02/20/20 02/27/20  Wieters, Hallie C, PA-C  guaiFENesin-codeine 100-10 MG/5ML syrup Take 5 mLs by mouth every 6 (six) hours as needed for cough (nightime). 10/09/20   Wieters, Hallie C, PA-C  hydrochlorothiazide (MICROZIDE) 12.5 MG capsule Take 12.5 mg by mouth daily.    [provider]  ipratropium (ATROVENT) 0.03 % nasal spray SMARTSIG:1-2 Spray(s) Both Nares 1 to 2 Times Daily 04/06/20   [provider]  LORazepam (ATIVAN) 0.5 MG tablet SMARTSIG:1 Tablet(s) By Mouth 3 Times a Week PRN 06/23/20   [provider]  methocarbamol (ROBAXIN) 750 MG tablet Take 750 mg by mouth 3 (three) times daily as needed. 10/09/20   [provider]  montelukast (SINGULAIR) 10  MG tablet Take 1 tablet by mouth as needed. 01/02/17   [provider]  tamsulosin (FLOMAX) 0.4 MG CAPS capsule Take 1 capsule (0.4 mg total) by mouth daily. 01/22/21   Particia Nearing, PA-C  traMADol (ULTRAM) 50 MG tablet Take 1 tablet (50 mg total) by mouth every 8 (eight) hours as needed. 06/29/20   Georgetta Haber, NP  Vitamin D, Ergocalciferol, (DRISDOL) 1.25 MG (50000 UNIT) CAPS capsule Take 50,000 Units by mouth once a week. 12/04/20   [provider]  VOLTAREN 1 % GEL Apply 1 application topically as needed. 01/27/17   [provider]    Family History Family History  Problem Relation Age of Onset  . Cancer Mother        Lung  . Hypertension Father   . Heart attack Father   . Heart failure Father   . Hypertension Sister   . Hypertension Brother   . Diabetes Brother   . Congenital  heart disease Brother   . Valvular heart disease Brother   . Heart failure Brother     Social History Social History   Tobacco Use  . Smoking status: Current Every Day Smoker    Packs/day: 0.50    Types: Cigarettes  . Smokeless tobacco: Never Used  Vaping Use  . Vaping Use: Never used  Substance Use Topics  . Alcohol use: Yes    Alcohol/week: 0.0 standard drinks    Comment: Rare  . Drug use: No     Allergies   Latex   Review of Systems Review of Systems  Respiratory: Negative.   Cardiovascular: Negative.   Gastrointestinal: Negative.   Genitourinary: Positive for hematuria and urgency. Negative for dysuria, frequency, vaginal bleeding, vaginal discharge and vaginal pain.     Physical Exam Triage Vital Signs ED Triage Vitals  Enc Vitals Group     BP 04/05/21 0956 135/87     Pulse Rate 04/05/21 0956 68     Resp 04/05/21 0956 17     Temp 04/05/21 0956 98.4 F (36.9 C)     Temp Source 04/05/21 0956 Oral     SpO2 04/05/21 0956 99 %     Weight --      Height --      Head Circumference --      Peak Flow --      Pain Score 04/05/21 0957 6     Pain Loc --      Pain Edu? --      Excl. in GC? --    No data found.  Updated Vital Signs BP 135/87 (BP Location: Right Arm)   Pulse 68   Temp 98.4 F (36.9 C) (Oral)   Resp 17   SpO2 99%   Visual Acuity Right Eye Distance:   Left Eye Distance:   Bilateral Distance:    Right Eye Near:   Left Eye Near:    Bilateral Near:     Physical Exam Vitals and nursing note reviewed.  Constitutional:      Appearance: She is not ill-appearing.  Cardiovascular:     Rate and Rhythm: Normal rate and regular rhythm.     Pulses: Normal pulses.     Heart sounds: Normal heart sounds.  Pulmonary:     Effort: Pulmonary effort is normal.     Breath sounds: Normal breath sounds.  Abdominal:     General: Bowel sounds are normal.     Tenderness: There is no guarding or rebound.  Genitourinary:  Comments: Left flank  tenderness. Musculoskeletal:        General: Normal range of motion.  Skin:    General: Skin is warm and dry.  Neurological:     Mental Status: She is alert.      UC Treatments / Results  Labs (all labs ordered are listed, but only abnormal results are displayed) Labs Reviewed  POCT URINALYSIS DIPSTICK, ED / UC - Abnormal; Notable for the following components:      Result Value   Hgb urine dipstick LARGE (*)    Leukocytes,Ua SMALL (*)    All other components within normal limits  URINE CULTURE    EKG   Radiology No results found.  Procedures Procedures (including critical care time)  Medications Ordered in UC Medications - No data to display  Initial Impression / Assessment and Plan / UC Course  I have reviewed the triage vital signs and the nursing notes.  Pertinent labs & imaging results that were available during my care of the patient were reviewed by me and considered in my medical decision making (see chart for details).     1.  Acute pyelonephritis: Point-of-care urine is significant for leukocyte esterase and hemoglobin Urine cultures have been sent Bactrim double strength twice daily for 7 days Tylenol/Motrin as needed for pain M/or fever Return to urgent care if symptoms worsen Follow-up with your urologist. Final Clinical Impressions(s) / UC Diagnoses   Final diagnoses:  Acute pyelonephritis     Discharge Instructions     Please take medications as prescribed If you have worsening symptoms please go to the urgent care to be reevaluated We will call you with recommendations if labs are abnormal.   ED Prescriptions    Medication Sig Dispense Auth. Provider   sulfamethoxazole-trimethoprim (BACTRIM DS) 800-160 MG tablet Take 1 tablet by mouth 2 (two) times daily for 7 days. 14 tablet Jadalee Westcott, Britta Mccreedy, MD     PDMP not reviewed this encounter.   Merrilee Jansky, MD 04/05/21 1046

## 2021-04-05 NOTE — ED Triage Notes (Signed)
Pt presents with lower abdominal pain, left side flank pain, and blood in urine X 1 week.

## 2021-04-06 ENCOUNTER — Telehealth (HOSPITAL_COMMUNITY): Payer: Self-pay | Admitting: Emergency Medicine

## 2021-04-06 LAB — URINE CULTURE: Culture: <10000 — AB

## 2021-04-06 MED ORDER — FLUCONAZOLE 150 MG PO TABS: 150.0000 mg | ORAL_TABLET | Freq: Once | ORAL | 0 refills | Status: AC

## 2021-04-06 NOTE — Telephone Encounter (Signed)
Patient requesting Diflucan for yeast infection due to antibiotics.  Sent to pharmacy, per protocol

## 2021-04-14 ENCOUNTER — Encounter: Payer: Self-pay | Admitting: Nurse Practitioner

## 2021-04-14 ENCOUNTER — Other Ambulatory Visit: Payer: Self-pay

## 2021-04-14 ENCOUNTER — Ambulatory Visit (INDEPENDENT_AMBULATORY_CARE_PROVIDER_SITE_OTHER): Payer: Medicaid Other | Admitting: Nurse Practitioner

## 2021-04-14 VITALS — BP 118/78 | Ht 68.0 in | Wt 176.0 lb

## 2021-04-14 DIAGNOSIS — Z780 Asymptomatic menopausal state: Secondary | ICD-10-CM | POA: Diagnosis not present

## 2021-04-14 DIAGNOSIS — M85851 Other specified disorders of bone density and structure, right thigh: Secondary | ICD-10-CM

## 2021-04-14 DIAGNOSIS — Z01419 Encounter for gynecological examination (general) (routine) without abnormal findings: Secondary | ICD-10-CM | POA: Diagnosis not present

## 2021-04-14 DIAGNOSIS — N951 Menopausal and female climacteric states: Secondary | ICD-10-CM

## 2021-04-14 DIAGNOSIS — M85852 Other specified disorders of bone density and structure, left thigh: Secondary | ICD-10-CM

## 2021-04-14 MED ORDER — ESTRADIOL 0.1 MG/GM VA CREA: TOPICAL_CREAM | VAGINAL | 11 refills | Status: DC

## 2021-04-14 NOTE — Progress Notes (Signed)
   Donna Simpson 03-02-1961 [id number]   History:  60 y.o. G2P2002 presents for annual exam. Postmenopausal - no HRT, no bleeding. 1996 TVH. She complains of vaginal dryness and pain with intercourse. She has tried OTC lubricants with no improvement.  Normal pap and mammogram history. Osteopenia. She is being followed by urology for possible kidney stones.   Gynecologic History No LMP recorded. Patient has had a hysterectomy.   Contraception/Family planning: status post hysterectomy  Health Maintenance Last Pap: 04/03/2020. Results were: normal Last mammogram: 11/10/2020. Results were: normal Last colonoscopy: 2015. Results were: normal Last Dexa: 04/14/2020. Results were: T-score -2.1, FRAX 9.4% / 2.4%  Past medical history, past surgical history, family history and social history were all reviewed and documented in the EPIC chart. Engaged. 99 yo daughter, 2 yo son.   ROS:  A ROS was performed and pertinent positives and negatives are included.  Exam:  Vitals:   04/14/21 0939  BP: 118/78  Weight: 176 lb (79.8 kg)  Height: 5\' 8"  (1.727 m)   Body mass index is 26.76 kg/m.  General appearance:  Normal Thyroid:  Symmetrical, normal in size, without palpable masses or nodularity. Respiratory  Auscultation:  Clear without wheezing or rhonchi Cardiovascular  Auscultation:  Regular rate, without rubs, murmurs or gallops  Edema/varicosities:  Not grossly evident Abdominal  Soft,nontender, without masses, guarding or rebound.  Liver/spleen:  No organomegaly noted  Hernia:  None appreciated  Skin  Inspection:  Grossly normal Breasts: Examined lying and sitting.   Right: Without masses, retractions, nipple discharge or axillary adenopathy.   Left: Without masses, retractions, nipple discharge or axillary adenopathy. Genitourinary   Inguinal/mons:  Normal without inguinal adenopathy  External genitalia:  Normal appearing vulva with no masses, tenderness, or  lesions  BUS/Urethra/Skene's glands:  Normal  Vagina:  Normal appearing with normal color and discharge, no lesions  Cervix:  Absent  Uterus:  Absent  Adnexa/parametria:     Rt: Normal in size, without masses or tenderness.   Lt: Normal in size, without masses or tenderness.  Anus and perineum: Normal  Digital rectal exam: Normal sphincter tone without palpated masses or tenderness  Assessment/Plan:  60 y.o. 42 for annual exam.   Well female exam with routine gynecological exam - Education provided on SBEs, importance of preventative screenings, current guidelines, high calcium diet, regular exercise, and multivitamin daily. Labs with PCP.   Postmenopausal - no HRT. History of TVH.   Osteopenia of both hips - Last Dexa 04/14/2020 T-score -2.1, FRAX 9.4% / 2.4%. Continue daily Vitamin D supplement and regular exercise. We will repeat DXA next year.   Menopausal vaginal dryness - Plan: estradiol (ESTRACE VAGINAL) 0.1 MG/GM vaginal cream daily x 2 weeks, then every other day x 2 weeks, then twice weekly. We discussed the slight risk for systemic absorption and risks associated.  Screening for cervical cancer - Normal Pap history.  Discussed the option to stop screening per guidelines. We will reassess on an annual basis.  Screening for breast cancer - Normal mammogram history.  Continue annual screenings. Normal breast exam today.  Screening for colon cancer - 2015 colonoscopy. Will repeat at GI's recommended interval.   Return in 1 year for annual.      2016 DNP, 9:54 AM 04/14/2021

## 2021-04-14 NOTE — Patient Instructions (Signed)
Health Maintenance, Female Adopting a healthy lifestyle and getting preventive care are important in promoting health and wellness. Ask your health care provider about:  The right schedule for you to have regular tests and exams.  Things you can do on your own to prevent diseases and keep yourself healthy. What should I know about diet, weight, and exercise? Eat a healthy diet  Eat a diet that includes plenty of vegetables, fruits, low-fat dairy products, and lean protein.  Do not eat a lot of foods that are high in solid fats, added sugars, or sodium.   Maintain a healthy weight Body mass index (BMI) is used to identify weight problems. It estimates body fat based on height and weight. Your health care provider can help determine your BMI and help you achieve or maintain a healthy weight. Get regular exercise Get regular exercise. This is one of the most important things you can do for your health. Most adults should:  Exercise for at least 150 minutes each week. The exercise should increase your heart rate and make you sweat (moderate-intensity exercise).  Do strengthening exercises at least twice a week. This is in addition to the moderate-intensity exercise.  Spend less time sitting. Even light physical activity can be beneficial. Watch cholesterol and blood lipids Have your blood tested for lipids and cholesterol at 59 years of age, then have this test every 5 years. Have your cholesterol levels checked more often if:  Your lipid or cholesterol levels are high.  You are older than 59 years of age.  You are at high risk for heart disease. What should I know about cancer screening? Depending on your health history and family history, you may need to have cancer screening at various ages. This may include screening for:  Breast cancer.  Cervical cancer.  Colorectal cancer.  Skin cancer.  Lung cancer. What should I know about heart disease, diabetes, and high blood  pressure? Blood pressure and heart disease  High blood pressure causes heart disease and increases the risk of stroke. This is more likely to develop in people who have high blood pressure readings, are of African descent, or are overweight.  Have your blood pressure checked: ? Every 3-5 years if you are 18-39 years of age. ? Every year if you are 40 years old or older. Diabetes Have regular diabetes screenings. This checks your fasting blood sugar level. Have the screening done:  Once every three years after age 40 if you are at a normal weight and have a low risk for diabetes.  More often and at a younger age if you are overweight or have a high risk for diabetes. What should I know about preventing infection? Hepatitis B If you have a higher risk for hepatitis B, you should be screened for this virus. Talk with your health care provider to find out if you are at risk for hepatitis B infection. Hepatitis C Testing is recommended for:  Everyone born from 1945 through 1965.  Anyone with known risk factors for hepatitis C. Sexually transmitted infections (STIs)  Get screened for STIs, including gonorrhea and chlamydia, if: ? You are sexually active and are younger than 59 years of age. ? You are older than 59 years of age and your health care provider tells you that you are at risk for this type of infection. ? Your sexual activity has changed since you were last screened, and you are at increased risk for chlamydia or gonorrhea. Ask your health care provider   if you are at risk.  Ask your health care provider about whether you are at high risk for HIV. Your health care provider may recommend a prescription medicine to help prevent HIV infection. If you choose to take medicine to prevent HIV, you should first get tested for HIV. You should then be tested every 3 months for as long as you are taking the medicine. Pregnancy  If you are about to stop having your period (premenopausal) and  you may become pregnant, seek counseling before you get pregnant.  Take 400 to 800 micrograms (mcg) of folic acid every day if you become pregnant.  Ask for birth control (contraception) if you want to prevent pregnancy. Osteoporosis and menopause Osteoporosis is a disease in which the bones lose minerals and strength with aging. This can result in bone fractures. If you are 65 years old or older, or if you are at risk for osteoporosis and fractures, ask your health care provider if you should:  Be screened for bone loss.  Take a calcium or vitamin D supplement to lower your risk of fractures.  Be given hormone replacement therapy (HRT) to treat symptoms of menopause. Follow these instructions at home: Lifestyle  Do not use any products that contain nicotine or tobacco, such as cigarettes, e-cigarettes, and chewing tobacco. If you need help quitting, ask your health care provider.  Do not use street drugs.  Do not share needles.  Ask your health care provider for help if you need support or information about quitting drugs. Alcohol use  Do not drink alcohol if: ? Your health care provider tells you not to drink. ? You are pregnant, may be pregnant, or are planning to become pregnant.  If you drink alcohol: ? Limit how much you use to 0-1 drink a day. ? Limit intake if you are breastfeeding.  Be aware of how much alcohol is in your drink. In the [place] one drink equals one 12 oz bottle of beer (355 mL), one 5 oz glass of wine (148 mL), or one 1 oz glass of hard liquor (44 mL). General instructions  Schedule regular health, dental, and eye exams.  Stay current with your vaccines.  Tell your health care provider if: ? You often feel depressed. ? You have ever been abused or do not feel safe at home. Summary  Adopting a healthy lifestyle and getting preventive care are important in promoting health and wellness.  Follow your health care provider's instructions about healthy  diet, exercising, and getting tested or screened for diseases.  Follow your health care provider's instructions on monitoring your cholesterol and blood pressure. This information is not intended to replace advice given to you by your health care provider. Make sure you discuss any questions you have with your health care provider. Document Revised: 10/24/2018 Document Reviewed: 10/24/2018 Elsevier Patient Education  2021 [employer]  

## 2021-04-16 ENCOUNTER — Telehealth: Payer: Self-pay | Admitting: *Deleted

## 2021-04-16 NOTE — Telephone Encounter (Signed)
Patient called stating the estradiol vaginal cream 0.1% was too expensive. She would like to have compound estradiol 0.02% vaginal cream called into [facility]. Okay to call in?

## 2021-04-18 NOTE — Telephone Encounter (Signed)
Yes, please call in. She will use the initial dose of 2 grams nightly x 2 weeks, then decrease to 2 grams every other night x 2 weeks, then 2 grams twice weekly. Thank you.

## 2021-04-19 MED ORDER — NONFORMULARY OR COMPOUNDED ITEM: 3 refills | Status: DC

## 2021-04-19 NOTE — Telephone Encounter (Signed)
Rx called in. Patient aware.  

## 2021-04-21 DIAGNOSIS — E559 Vitamin D deficiency, unspecified: Secondary | ICD-10-CM | POA: Insufficient documentation

## 2021-04-21 DIAGNOSIS — D531 Other megaloblastic anemias, not elsewhere classified: Secondary | ICD-10-CM | POA: Insufficient documentation

## 2021-04-21 DIAGNOSIS — J302 Other seasonal allergic rhinitis: Secondary | ICD-10-CM | POA: Insufficient documentation

## 2021-04-21 DIAGNOSIS — B351 Tinea unguium: Secondary | ICD-10-CM | POA: Insufficient documentation

## 2021-04-29 DIAGNOSIS — E785 Hyperlipidemia, unspecified: Secondary | ICD-10-CM | POA: Insufficient documentation

## 2021-04-29 DIAGNOSIS — E7800 Pure hypercholesterolemia, unspecified: Secondary | ICD-10-CM | POA: Insufficient documentation

## 2021-08-09 NOTE — Telephone Encounter (Signed)
erroe

## 2021-09-08 DIAGNOSIS — F430 Acute stress reaction: Secondary | ICD-10-CM | POA: Insufficient documentation

## 2021-09-08 DIAGNOSIS — G4700 Insomnia, unspecified: Secondary | ICD-10-CM | POA: Insufficient documentation

## 2021-09-08 DIAGNOSIS — M545 Low back pain, unspecified: Secondary | ICD-10-CM | POA: Insufficient documentation

## 2021-09-08 DIAGNOSIS — E538 Deficiency of other specified B group vitamins: Secondary | ICD-10-CM | POA: Insufficient documentation

## 2021-09-17 DIAGNOSIS — F17200 Nicotine dependence, unspecified, uncomplicated: Secondary | ICD-10-CM | POA: Insufficient documentation

## 2021-09-30 ENCOUNTER — Other Ambulatory Visit: Payer: Self-pay | Admitting: Nurse Practitioner

## 2021-09-30 DIAGNOSIS — Z1231 Encounter for screening mammogram for malignant neoplasm of breast: Secondary | ICD-10-CM

## 2021-11-11 ENCOUNTER — Ambulatory Visit
Admission: RE | Admit: 2021-11-11 | Discharge: 2021-11-11 | Disposition: A | Discharge status: Home / Self Care | Payer: Medicaid Other | Source: Ambulatory Visit | Attending: Nurse Practitioner | Admitting: Nurse Practitioner

## 2021-11-11 ENCOUNTER — Other Ambulatory Visit: Payer: Self-pay

## 2021-11-11 DIAGNOSIS — Z1231 Encounter for screening mammogram for malignant neoplasm of breast: Secondary | ICD-10-CM

## 2022-02-06 IMAGING — MG MM DIGITAL SCREENING BILAT W/ TOMO AND CAD
6 of 10 series · 6 of 30 positions shown · non-contrast
Comparison: Previous exam(s).

CLINICAL DATA: Screening.

EXAM:
DIGITAL SCREENING BILATERAL MAMMOGRAM WITH TOMOSYNTHESIS AND CAD
TECHNIQUE: Bilateral screening digital craniocaudal and mediolateral oblique
mammograms were obtained. Bilateral screening digital breast
tomosynthesis was performed. The images were evaluated with
computer-aided detection.

[R MLO synth-2D]
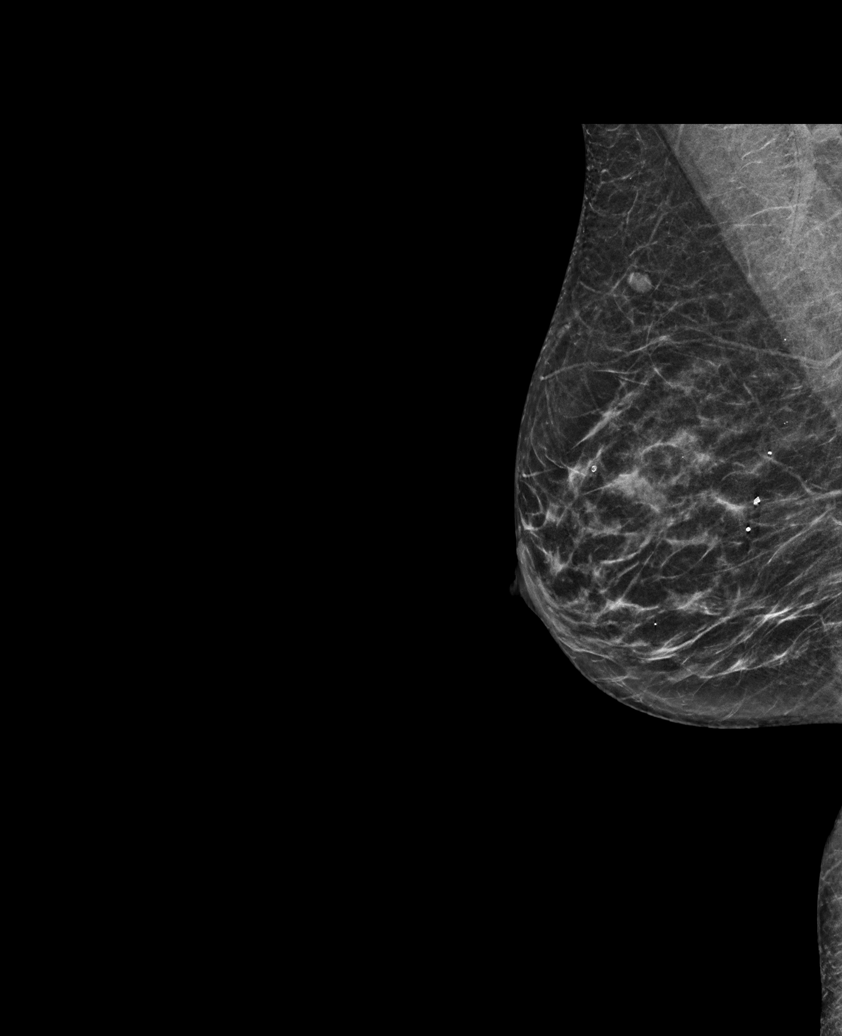

[L MLO synth-2D]
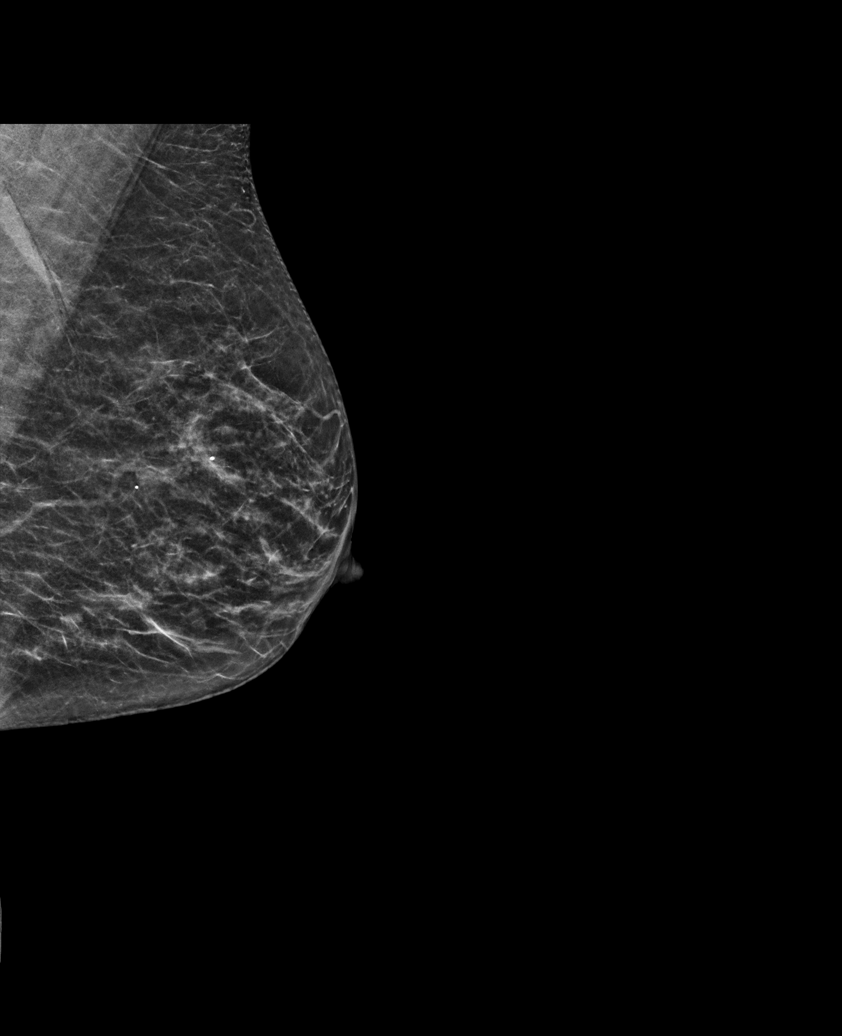

[R CC synth-2D]
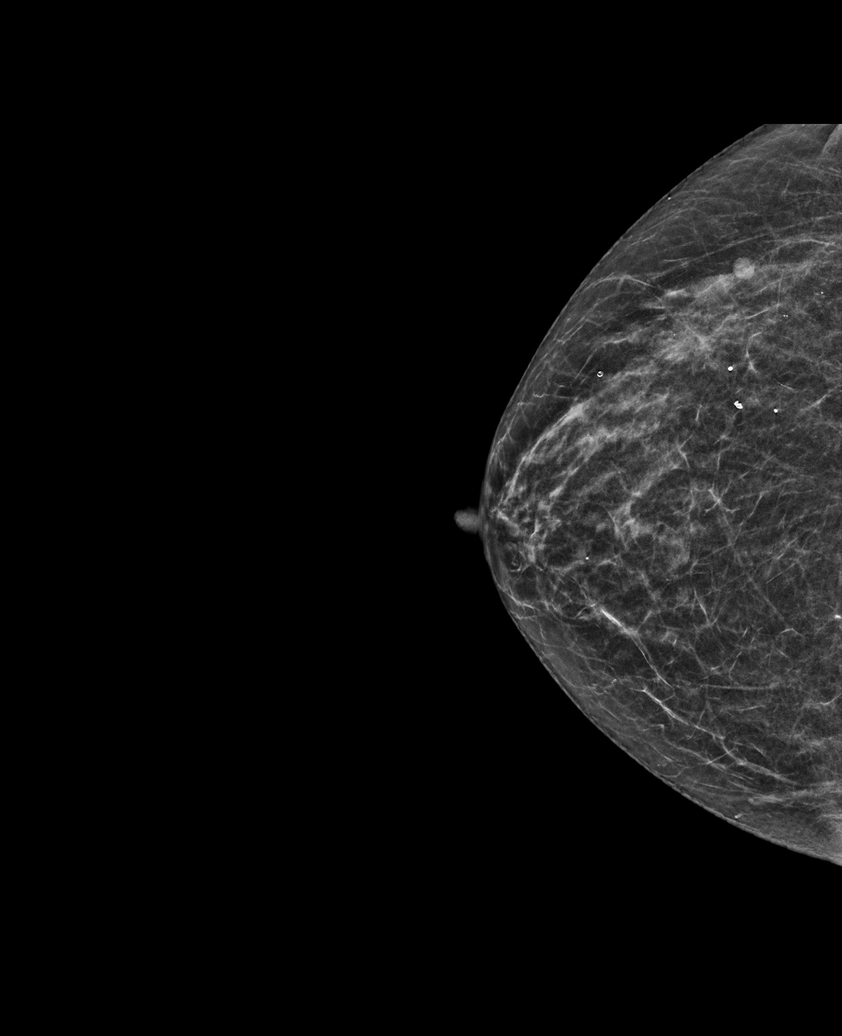

[L XCCL synth-2D]
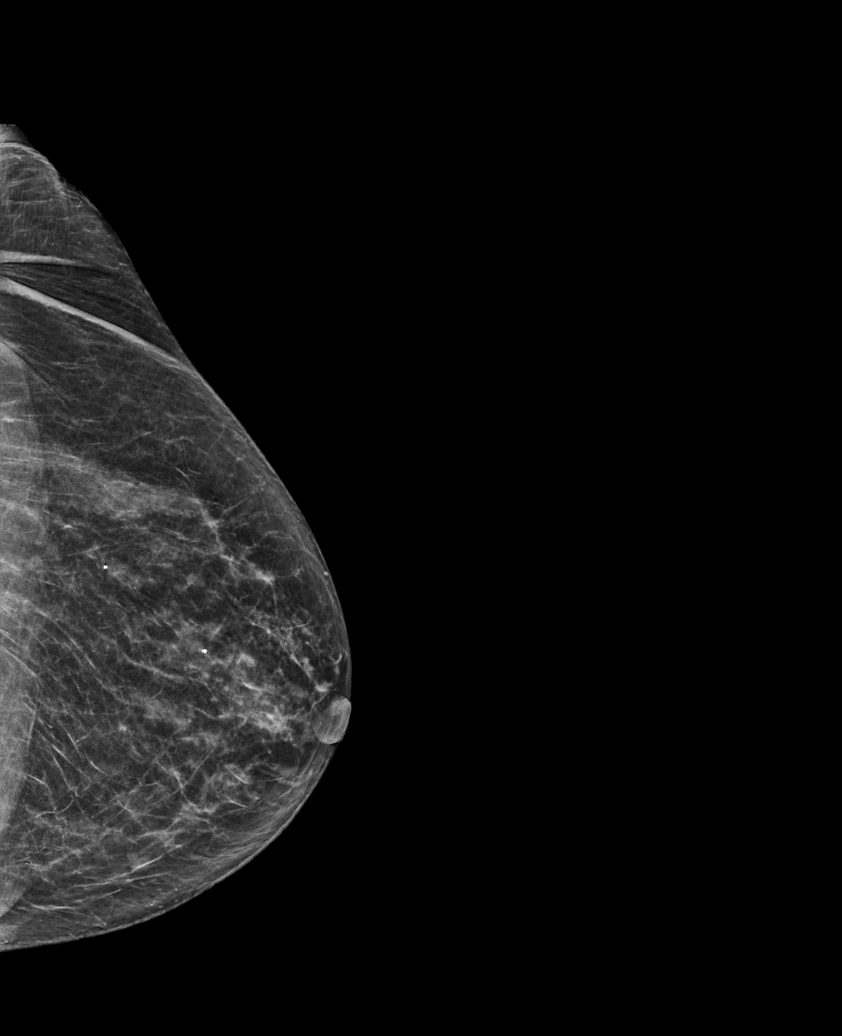

[L CC synth-2D]
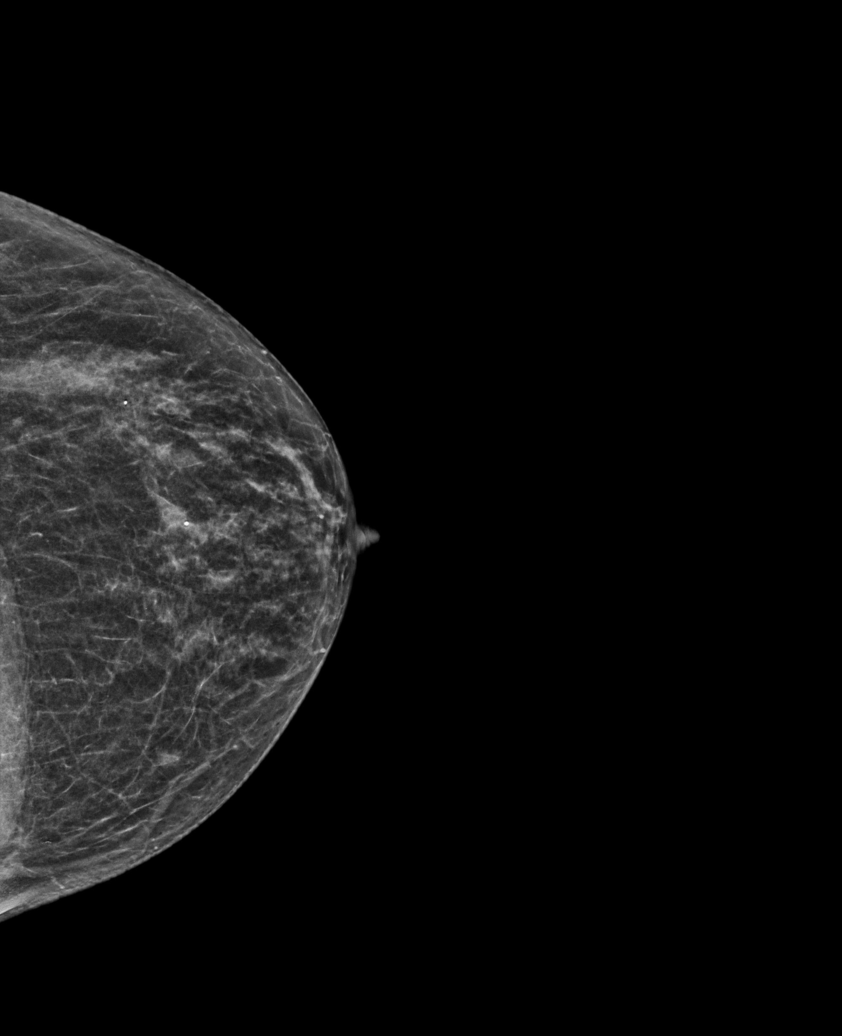

[R CC tomo · tomo slice 25/49.0]
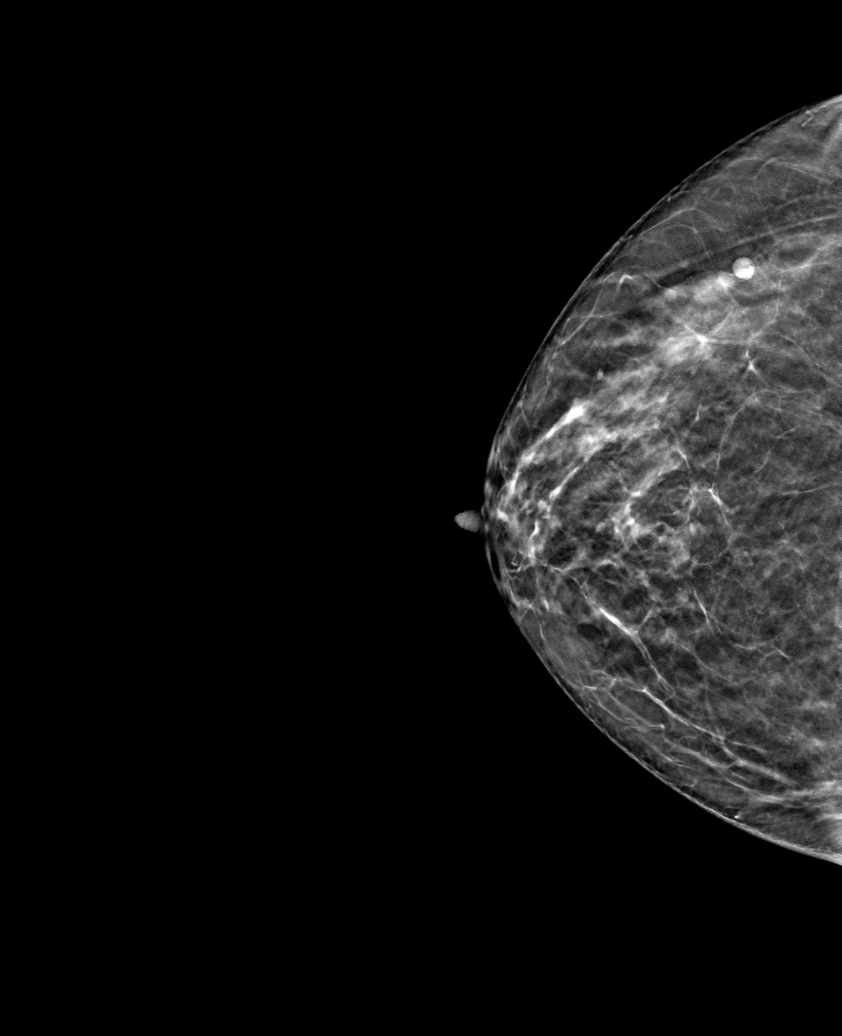

[6 of 30 positions shown; findings below may reference images not displayed]

ACR Breast Density Category b: There are scattered areas of
fibroglandular density.
FINDINGS: There are no findings suspicious for malignancy.
IMPRESSION: No mammographic evidence of malignancy. A result letter of this
screening mammogram will be mailed directly to the patient.

RECOMMENDATION:
Screening mammogram in one year. (Code:51-O-LD2)

BI-RADS CATEGORY  1: Negative.

## 2022-03-15 ENCOUNTER — Other Ambulatory Visit: Payer: Self-pay

## 2022-03-15 DIAGNOSIS — Z13820 Encounter for screening for osteoporosis: Secondary | ICD-10-CM

## 2022-03-17 ENCOUNTER — Ambulatory Visit (HOSPITAL_COMMUNITY)
Admission: EM | Admit: 2022-03-17 | Discharge: 2022-03-17 | Disposition: A | Discharge status: Home / Self Care | Payer: Medicaid Other | Attending: Internal Medicine | Admitting: Internal Medicine

## 2022-03-17 ENCOUNTER — Encounter (HOSPITAL_COMMUNITY): Payer: Self-pay

## 2022-03-17 DIAGNOSIS — L232 Allergic contact dermatitis due to cosmetics: Secondary | ICD-10-CM

## 2022-03-17 MED ORDER — HYDROXYZINE HCL 25 MG PO TABS: 25.0000 mg | ORAL_TABLET | Freq: Three times a day (TID) | ORAL | 0 refills | Status: DC | PRN

## 2022-03-17 MED ORDER — DEXAMETHASONE SODIUM PHOSPHATE 10 MG/ML IJ SOLN
INTRAMUSCULAR | Status: AC
  Filled 2022-03-17: qty 1

## 2022-03-17 MED ORDER — PREDNISONE 20 MG PO TABS: 40.0000 mg | ORAL_TABLET | Freq: Every day | ORAL | 0 refills | Status: AC

## 2022-03-17 MED ORDER — DEXAMETHASONE SODIUM PHOSPHATE 10 MG/ML IJ SOLN
10.0000 mg | Freq: Once | INTRAMUSCULAR | Status: AC
  Administered 2022-03-17: 10 mg via INTRAMUSCULAR

## 2022-03-17 NOTE — ED Triage Notes (Signed)
Pt states has a rash and swollen lymphnodes to back of neck after using a different brand of hair dye. C/o pain turning her neck. ?

## 2022-03-17 NOTE — ED Provider Notes (Addendum)
?[place] ? ? ? ?CSN: [id number] ?Arrival date & time: 03/17/22  [id number] ? ? ?  ? ?History   ?Chief Complaint ?Chief Complaint  ?Patient presents with  ? Rash  ? ? ?HPI ?Donna Simpson is a 61 y.o. female comes to the urgent care with itchy rash over the occipital scalp as well as tenderness over the lymph nodes in the back of the head.  Patient applied some diet to his head few days ago.  A couple of days after applying the dye she started experiencing intense itching over the occipital area of the scalp.  It is associated with rash over the posterior upper neck.  No fever or chills.  Patient also has some rash in the right antecubital fossa.  No dizziness, near syncope or syncopal episodes.  No shortness of breath or wheezing.  No tongue swelling..  ? ?HPI ? ?Past Medical History:  ?Diagnosis Date  ? Anxiety   ? Anxiety   ? Atypical chest pain 02/03/2017  ? Essential hypertension 02/03/2017  ? Hyperlipidemia 02/03/2017  ? Hypertension   ? Kidney stone   ? Leg cramping 02/03/2017  ? Tobacco abuse 03/10/2017  ? ? ?Patient Active Problem List  ? Diagnosis Date Noted  ? Family history of cardiomyopathy 06/05/2019  ? Tobacco abuse 03/10/2017  ? Essential hypertension 02/03/2017  ? Leg cramping 02/03/2017  ? Atypical chest pain 02/03/2017  ? ? ?Past Surgical History:  ?Procedure Laterality Date  ? BACK SURGERY    ? Spinal fusion  ? BREAST SURGERY    ? Biopsy--benign  ? FOOT SURGERY    ? PELVIC LAPAROSCOPY    ? SPINAL FUSION    ? VAGINAL HYSTERECTOMY    ? ? ?OB History   ? ? Gravida  ?2  ? Para  ?2  ? Term  ?2  ? Preterm  ?   ? AB  ?   ? Living  ?2  ?  ? ? SAB  ?   ? IAB  ?   ? Ectopic  ?   ? Multiple  ?   ? Live Births  ?   ?   ?  ?  ? ? ? ?Home Medications   ? ?Prior to Admission medications   ?Medication Sig Start Date End Date Taking? Authorizing Provider  ?hydrOXYzine (ATARAX) 25 MG tablet Take 1 tablet (25 mg total) by mouth every 8 (eight) hours as needed. 03/17/22  Yes Annelie Boak, Myrene Galas, MD  ?predniSONE  (DELTASONE) 20 MG tablet Take 2 tablets (40 mg total) by mouth daily for 5 days. 03/17/22 03/22/22 Yes Lyndell Allaire, Myrene Galas, MD  ?Calcium Carbonate-Vitamin D (CALCIUM + D PO) Take by mouth.    [provider]  ?ergocalciferol (VITAMIN D2) 1.25 MG (50000 UT) capsule 1 capsule    [provider]  ?fluticasone (FLONASE) 50 MCG/ACT nasal spray Place 1-2 sprays into both nostrils daily for 7 days. 02/20/20 02/27/20  Wieters, Hallie C, PA-C  ?hydrochlorothiazide (MICROZIDE) 12.5 MG capsule Take 12.5 mg by mouth daily.    [provider]  ?ipratropium (ATROVENT) 0.03 % nasal spray SMARTSIG:1-2 Spray(s) Both Nares 1 to 2 Times Daily 04/06/20   [provider]  ?montelukast (SINGULAIR) 10 MG tablet Take 1 tablet by mouth as needed. 01/02/17   [provider]  ?NONFORMULARY OR COMPOUNDED ITEM Estradiol vaginal cream 0.02% insert 2 grams nightly x 2 weeks, then decrease to 2 grams every other night x 2 weeks, then 2 grams twice  weekly 04/19/21   Tamela Gammon, NP  ?Vitamin D, Ergocalciferol, (DRISDOL) 1.25 MG (50000 UNIT) CAPS capsule Take 50,000 Units by mouth once a week. 12/04/20   [provider]  ?VOLTAREN 1 % GEL Apply 1 application topically as needed. 01/27/17   [provider]  ? ? ?Family History ?Family History  ?Problem Relation Age of Onset  ? Cancer Mother   ?     Lung  ? Hypertension Father   ? Heart attack Father   ? Heart failure Father   ? Hypertension Sister   ? Hypertension Brother   ? Diabetes Brother   ? Congenital heart disease Brother   ? Valvular heart disease Brother   ? Heart failure Brother   ? ? ?Social History ?Social History  ? ?Tobacco Use  ? Smoking status: Every Day  ?  Packs/day: 0.50  ?  Types: Cigarettes  ? Smokeless tobacco: Never  ?Vaping Use  ? Vaping Use: Never used  ?Substance Use Topics  ? Alcohol use: Yes  ?  Alcohol/week: 0.0 standard drinks  ?  Comment: Rare  ? Drug use: No  ? ? ? ?Allergies   ?Latex ? ? ?Review of Systems ?Review  of Systems ?As per HPI ? ?Physical Exam ?Triage Vital Signs ?ED Triage Vitals  ?Enc Vitals Group  ?   BP 03/17/22 1012 (!) 165/87  ?   Pulse Rate 03/17/22 1012 68  ?   Resp 03/17/22 1012 18  ?   Temp 03/17/22 1012 97.8 ?F (36.6 ?C)  ?   Temp Source 03/17/22 1012 Oral  ?   SpO2 03/17/22 1012 99 %  ?   Weight --   ?   Height --   ?   Head Circumference --   ?   Peak Flow --   ?   Pain Score 03/17/22 1013 7  ?   Pain Loc --   ?   Pain Edu? --   ?   Excl. in [place]? --   ? ?No data found. ? ?Updated Vital Signs ?BP (!) 165/87 (BP Location: Left Arm)   Pulse 68   Temp 97.8 ?F (36.6 ?C) (Oral)   Resp 18   SpO2 99%  ? ?Visual Acuity ?Right Eye Distance:   ?Left Eye Distance:   ?Bilateral Distance:   ? ?Right Eye Near:   ?Left Eye Near:    ?Bilateral Near:    ? ?Physical Exam ?Vitals and nursing note reviewed.  ?Constitutional:   ?   Appearance: Normal appearance.  ?Cardiovascular:  ?   Rate and Rhythm: Normal rate and regular rhythm.  ?Musculoskeletal:     ?   General: Normal range of motion.  ?Skin: ?   Comments: Papular rashes over the posterior aspect of the neck.  No erythema.  No occipital lymph nodes palpated.  ?Neurological:  ?   Mental Status: She is alert.  ? ? ? ?UC Treatments / Results  ?Labs ?(all labs ordered are listed, but only abnormal results are displayed) ?Labs Reviewed - No data to display ? ?EKG ? ? ?Radiology ?No results found. ? ?Procedures ?Procedures (including critical care time) ? ?Medications Ordered in UC ?Medications  ?dexamethasone (DECADRON) injection 10 mg (10 mg Intramuscular Given 03/17/22 1046)  ? ? ?Initial Impression / Assessment and Plan / UC Course  ?I have reviewed the triage vital signs and the nursing notes. ? ?Pertinent labs & imaging results that were available during my care of the patient were reviewed  by me and considered in my medical decision making (see chart for details). ? ?  ? ?1.  Allergic contact dermatitis secondary to cosmetics: ?Decadron 10 mg IM x1 dose ?Prednisone  40 mg orally daily for 5 days ?Hydroxyzine as needed for itching ?Return precautions given ?Avoid hair dye use ?Final Clinical Impressions(s) / UC Diagnoses  ? ?Final diagnoses:  ?Allergic contact dermatitis due to cosmetics  ? ? ? ?Discharge Instructions   ? ?  ?These take medications as prescribed ?Please avoid head eyes that cause you to break out in a rash ?Tylenol/Motrin as needed for pain ?Return to urgent care if symptoms worsen. ? ? ?ED Prescriptions   ? ? Medication Sig Dispense Auth. Provider  ? predniSONE (DELTASONE) 20 MG tablet Take 2 tablets (40 mg total) by mouth daily for 5 days. 10 tablet Rondell Pardon, Myrene Galas, MD  ? hydrOXYzine (ATARAX) 25 MG tablet Take 1 tablet (25 mg total) by mouth every 8 (eight) hours as needed. 21 tablet Duey Liller, Myrene Galas, MD  ? ?  ? ?PDMP not reviewed this encounter. ?  ?Chase Picket, MD ?03/17/22 1447 ? ?  ?Chase Picket, MD ?03/17/22 1448 ? ?

## 2022-03-17 NOTE — Discharge Instructions (Addendum)
These take medications as prescribed ?Please avoid head eyes that cause you to break out in a rash ?Tylenol/Motrin as needed for pain ?Return to urgent care if symptoms worsen. ?

## 2022-03-30 DIAGNOSIS — T7840XA Allergy, unspecified, initial encounter: Secondary | ICD-10-CM | POA: Insufficient documentation

## 2022-04-26 ENCOUNTER — Ambulatory Visit (INDEPENDENT_AMBULATORY_CARE_PROVIDER_SITE_OTHER): Payer: Medicaid Other

## 2022-04-26 ENCOUNTER — Other Ambulatory Visit: Payer: Self-pay | Admitting: Nurse Practitioner

## 2022-04-26 DIAGNOSIS — M8589 Other specified disorders of bone density and structure, multiple sites: Secondary | ICD-10-CM | POA: Diagnosis not present

## 2022-04-26 DIAGNOSIS — Z13820 Encounter for screening for osteoporosis: Secondary | ICD-10-CM

## 2022-04-28 ENCOUNTER — Ambulatory Visit (INDEPENDENT_AMBULATORY_CARE_PROVIDER_SITE_OTHER): Payer: Medicaid Other | Admitting: Nurse Practitioner

## 2022-04-28 ENCOUNTER — Encounter: Payer: Self-pay | Admitting: Nurse Practitioner

## 2022-04-28 VITALS — BP 124/82 | Ht 68.0 in | Wt 178.0 lb

## 2022-04-28 DIAGNOSIS — Z780 Asymptomatic menopausal state: Secondary | ICD-10-CM | POA: Diagnosis not present

## 2022-04-28 DIAGNOSIS — M8589 Other specified disorders of bone density and structure, multiple sites: Secondary | ICD-10-CM | POA: Diagnosis not present

## 2022-04-28 DIAGNOSIS — Z01419 Encounter for gynecological examination (general) (routine) without abnormal findings: Secondary | ICD-10-CM | POA: Diagnosis not present

## 2022-04-28 NOTE — Progress Notes (Signed)
   Donna Simpson Nov 28, 1960 [id number]   History:  61 y.o. G2P2002 presents for annual exam. Postmenopausal - no HRT, no bleeding. S/P 1996 TVH. Normal pap and mammogram history. Osteopenia. HLD, HTN managed by PCP.   Gynecologic History No LMP recorded. Patient has had a hysterectomy.   Contraception/Family planning: status post hysterectomy Sexually active: Yes  Health Maintenance Last Pap: 04/03/2020. Results were: Normal Last mammogram: 11/11/2021. Results were: Normal Last colonoscopy: 2015. Results were: Normal Last Dexa: 04/26/2022. Results were: T-score -2.1, FRAX 9.6% / 2.5%  Past medical history, past surgical history, family history and social history were all reviewed and documented in the EPIC chart. Engaged. 34 yo daughter, 56 yo son. 3 granddaughters.   ROS:  A ROS was performed and pertinent positives and negatives are included.  Exam:  Vitals:   04/28/22 0937  BP: 124/82  Weight: 178 lb (80.7 kg)  Height: 5\' 8"  (1.727 m)    Body mass index is 27.06 kg/m.  General appearance:  Normal Thyroid:  Symmetrical, normal in size, without palpable masses or nodularity. Respiratory  Auscultation:  Clear without wheezing or rhonchi Cardiovascular  Auscultation:  Regular rate, without rubs, murmurs or gallops  Edema/varicosities:  Not grossly evident Abdominal  Soft,nontender, without masses, guarding or rebound.  Liver/spleen:  No organomegaly noted  Hernia:  None appreciated  Skin  Inspection:  Grossly normal Breasts: Examined lying and sitting.   Right: Without masses, retractions, nipple discharge or axillary adenopathy.   Left: Without masses, retractions, nipple discharge or axillary adenopathy. Genitourinary   Inguinal/mons:  Normal without inguinal adenopathy  External genitalia:  Normal appearing vulva with no masses, tenderness, or lesions  BUS/Urethra/Skene's glands:  Normal  Vagina:  Normal appearing with normal color and discharge, no  lesions  Cervix:  Absent  Uterus:  Absent  Adnexa/parametria:     Rt: Normal in size, without masses or tenderness.   Lt: Normal in size, without masses or tenderness.  Anus and perineum: Normal  Digital rectal exam: Normal sphincter tone without palpated masses or tenderness  Patient informed chaperone available to be present for breast and pelvic exam. Patient has requested no chaperone to be present. Patient has been advised what will be completed during breast and pelvic exam.   Assessment/Plan:  61 y.o. G2P2002 for annual exam.   Well female exam with routine gynecological exam - Education provided on SBEs, importance of preventative screenings, current guidelines, high calcium diet, regular exercise, and multivitamin daily. Labs with PCP.   Postmenopausal - no HRT. S/PTVH.   Osteopenia of multiple sites - T-score -2.1 without elevated FRAX 04/26/2022. Continue daily Vitamin D supplement and regular exercise.  Works out 3-5 days per week doing cardio and weight training.   Screening for cervical cancer - Normal Pap history.  Discussed the option to stop screening per guidelines. We will reassess on an annual basis.  Screening for breast cancer - Normal mammogram history.  Continue annual screenings. Normal breast exam today.  Screening for colon cancer - 2015 colonoscopy. Will repeat at GI's recommended interval.   Return in 1 year for annual.      2016 DNP, 10:00 AM 04/28/2022

## 2022-05-25 DIAGNOSIS — E041 Nontoxic single thyroid nodule: Secondary | ICD-10-CM | POA: Insufficient documentation

## 2022-05-25 DIAGNOSIS — Z6830 Body mass index (BMI) 30.0-30.9, adult: Secondary | ICD-10-CM | POA: Insufficient documentation

## 2022-05-26 ENCOUNTER — Ambulatory Visit (INDEPENDENT_AMBULATORY_CARE_PROVIDER_SITE_OTHER): Payer: Medicaid Other | Admitting: Allergy & Immunology

## 2022-05-26 VITALS — BP 118/68 | HR 75 | Temp 98.0°F | Ht 68.0 in | Wt 178.0 lb

## 2022-05-26 DIAGNOSIS — J310 Chronic rhinitis: Secondary | ICD-10-CM | POA: Diagnosis not present

## 2022-05-26 DIAGNOSIS — L239 Allergic contact dermatitis, unspecified cause: Secondary | ICD-10-CM

## 2022-05-26 DIAGNOSIS — T7840XA Allergy, unspecified, initial encounter: Secondary | ICD-10-CM | POA: Diagnosis not present

## 2022-05-26 DIAGNOSIS — T7840XD Allergy, unspecified, subsequent encounter: Secondary | ICD-10-CM

## 2022-05-26 NOTE — Progress Notes (Signed)
NEW PATIENT  Date of Service/Encounter:  05/26/22  Consult requested by: Diamantina Providence, FNP   Assessment:   Chronic rhinitis - with negative testing today  Allergic reaction - multiple triggers but negative testing today  Allergic contact dermatitis - planning for patch testing  Disabled status due to chronic back pain  Plan/Recommendations:    1. Chronic rhinitis - Testing today showed: negative to the entire panel (we will confirm with blood work) - Copy of test results provided.  - Avoidance measures provided. - Stop taking:  - Continue with: Singulair (montelukast) 10mg  daily, Flonase (fluticasone) one spray per nostril daily (AIM FOR EAR ON EACH SIDE), and Atrovent (ipratropium) 0.03% one spray per nostril 2-3 times daily as needed (CAN BE OVER DRYING) - You can use an extra dose of the antihistamine, if needed, for breakthrough symptoms.  - Consider nasal saline rinses 1-2 times daily to remove allergens from the nasal cavities as well as help with mucous clearance (this is especially helpful to do before the nasal sprays are given) - We can discuss more management ideas once we get the blood work back.   2. Allergic reaction - Testing to the requested foods was negative. - There is a the low positive predictive value of food allergy testing and hence the high possibility of false positives. - In contrast, food allergy testing has a high negative predictive value, therefore if testing is negative we can be relatively assured that they are indeed negative.  - These could be intolerances as well, which are not as severe and do not require the need for an EpiPen.   3. Allergic contact dermatitis - We are going to schedule you for patch testing. - this looks for chemical sensitivities including fragrance.  - Bring some samples of different bandaids with different adhesives so we can narrow down which one causes the worse reactions.  - These are placed on Mondays and  then read on Wednesdays and Fridays.  4. Return in about 2 weeks (around 06/09/2022) for [facility] TESTING.    This note in its entirety was forwarded to the Provider who requested this consultation.  Subjective:   Donna Simpson is a 61 y.o. 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  02/05/2017 was referred by Demetrius Charity, FNP.     Donna Simpson is a 60 y.o. female presenting for an evaluation of allergic reactions . She has noticed that she has a lot of allergies that result in itching and a rash.    Allergic Rhinitis  Symptom History: She does have some sneezing and itchy watery eyes during the pollen season. She does have Claritin and a nasal spray. She has never been tested for anything at all. She has never bene on allergy shots.  She does "keep" sinus infections. She this on montelukast which she takes only as needed. She has only taken two this year.   Food Allergy Symptom History: She is allergic to certain shellfish and  berries.  She reports that they cause itching and a rash. She eats peanuts and tree nuts. She eats yogurt and cheese; milk hurts her stomach. She does drink soy milk milk without a problem. She does tolerate eggs without a problem. She eats fin fish without a problem. She has noticed that smelling crab during crab boils leads to issues.   Skin Symptom History: She has reactions to hair dyes and adhesives for bandaids. She had a minor surgery last year and she reacted to the bandaid adhesive. She does report some eczema. She has a number of different ointments to use. She wants to know what to avoid. She does not have pictures of the rash. She had some enlarged lymph nodes. She thinks that she might be allergic to henna ink.  She does have hydroxyzine on board to use as needed for itching.   She has a history of back pain and had a spinal fusion. She uses the Robaxin and Tramadol as needed. She had surgery performed in March 2015. She is on disability for this. 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 She has 3 granddaughters.   Otherwise, there is no history of other atopic diseases, including drug allergies, stinging insect allergies, eczema, urticaria, or contact dermatitis. There is no significant infectious history. Vaccinations are up to date.    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 If you have any questions, ask your nurse or doctor.          CALCIUM + D PO Take by mouth.   cyanocobalamin 1000 MCG/ML injection Commonly known as: (VITAMIN B-12) Inject 1,000 mcg into the skin every 14 (fourteen) days.   fluticasone 50 MCG/ACT nasal spray Commonly known as: FLONASE Place 1-2 sprays into both nostrils daily for 7 days.   hydrochlorothiazide 12.5 MG capsule Commonly known as: MICROZIDE Take 12.5 mg by mouth daily.   hydrOXYzine 25 MG tablet Commonly known as: ATARAX Take 1 tablet (25 mg total) by mouth every 8 (eight) hours as needed.  ipratropium 0.03 % nasal spray Commonly known as: ATROVENT SMARTSIG:1-2 Spray(s) Both Nares 1 to 2 Times Daily   methocarbamol 750 MG tablet Commonly known as: ROBAXIN methocarbamol 750 mg tablet  TAKE 1 TABLET BY MOUTH THREE TIMES DAILY FOR 20 DAYS AS NEEDED   montelukast 10 MG tablet Commonly known as: SINGULAIR Take 1 tablet by mouth as needed.   NONFORMULARY OR COMPOUNDED ITEM Estradiol vaginal cream 0.02% insert 2 grams nightly x 2 weeks, then decrease to 2 grams every other night x 2 weeks, then 2 grams twice weekly   traMADol 50 MG tablet Commonly known as: ULTRAM tramadol 50 mg tablet  TAKE 1 TABLET BY MOUTH THREE TIMES DAILY FOR 10 DAYS AS DIRECTED   ergocalciferol 1.25 MG (50000 UT) capsule Commonly known as: VITAMIN D2 1 capsule   Vitamin D (Ergocalciferol) 1.25 MG (50000 UNIT) Caps capsule Commonly known as: DRISDOL Take 50,000 Units by mouth once a week.   Voltaren 1 % Gel Generic drug: diclofenac Sodium Apply 1 application topically as  needed.        Birth History: non-contributory  Developmental History: non-contributory  Past Surgical History: Past Surgical History:  Procedure Laterality Date   BACK SURGERY     Spinal fusion   BREAST SURGERY     Biopsy--benign   FOOT SURGERY     PELVIC LAPAROSCOPY     SPINAL FUSION     VAGINAL HYSTERECTOMY       Family History: Family History  Problem Relation Age of Onset   Cancer Mother        Lung   Hypertension Father    Heart attack Father    Heart failure Father    Hypertension Sister    Hypertension Brother    Diabetes Brother    Congenital heart disease Brother    Valvular heart disease Brother    Heart failure Brother      Social History: Tinamarie lives at home in a house that is 60 years old.  There is vinyl in the main living areas and carpeting in the bedroom.  She has a heat pump for heating and central cooling.  There are no indoor outdoor animals.  There are dust mite covers on the bedding.  There is no tobacco exposure.  She currently is disabled.  She does use a HEPA filter.  She does not live near an interstate or industrial area.   Review of Systems  Constitutional: Negative.  Negative for chills, fever, malaise/fatigue and weight loss.  HENT:  Positive for congestion. Negative for ear discharge, ear pain and nosebleeds.        Positive for recurrent sinus infections.  Eyes:  Negative for pain, discharge and redness.  Respiratory:  Negative for cough, sputum production, shortness of breath and wheezing.   Cardiovascular: Negative.  Negative for chest pain and palpitations.  Gastrointestinal:  Negative for abdominal pain, constipation, diarrhea, heartburn, nausea and vomiting.  Skin:  Positive for itching and rash.  Neurological:  Negative for dizziness and headaches.  Endo/Heme/Allergies:  Negative for environmental allergies. Does not bruise/bleed easily.       Objective:   Blood pressure 118/68, pulse 75, temperature 98 F (36.7 C),  temperature source Temporal, height 5\' 8"  (1.727 m), weight 178 lb (80.7 kg), SpO2 99 %. Body mass index is 27.06 kg/m.     Physical Exam Vitals reviewed.  Constitutional:      Appearance: She is well-developed.     Comments: Very talkative.   HENT:  Head: Normocephalic and atraumatic.     Right Ear: Tympanic membrane, ear canal and external ear normal. No drainage, swelling or tenderness. Tympanic membrane is not injected, scarred, erythematous, retracted or bulging.     Left Ear: Tympanic membrane, ear canal and external ear normal. No drainage, swelling or tenderness. Tympanic membrane is not injected, scarred, erythematous, retracted or bulging.     Nose: No nasal deformity, septal deviation, mucosal edema or rhinorrhea.     Right Turbinates: Enlarged, swollen and pale.     Left Turbinates: Enlarged, swollen and pale.     Right Sinus: No maxillary sinus tenderness or frontal sinus tenderness.     Left Sinus: No maxillary sinus tenderness or frontal sinus tenderness.     Mouth/Throat:     Mouth: Mucous membranes are not pale and not dry.     Pharynx: Uvula midline.  Eyes:     General:        Right eye: No discharge.        Left eye: No discharge.     Conjunctiva/sclera: Conjunctivae normal.     Right eye: Right conjunctiva is not injected. No chemosis.    Left eye: Left conjunctiva is not injected. No chemosis.    Pupils: Pupils are equal, round, and reactive to light.  Cardiovascular:     Rate and Rhythm: Normal rate and regular rhythm.     Heart sounds: Normal heart sounds.  Pulmonary:     Effort: Pulmonary effort is normal. No tachypnea, accessory muscle usage or respiratory distress.     Breath sounds: Normal breath sounds. No wheezing, rhonchi or rales.     Comments: Moving air well in all lung fields. No increased work of breathing noted.  Chest:     Chest wall: No tenderness.  Abdominal:     Tenderness: There is no abdominal tenderness. There is no guarding or  rebound.  Lymphadenopathy:     Head:     Right side of head: No submandibular, tonsillar or occipital adenopathy.     Left side of head: No submandibular, tonsillar or occipital adenopathy.     Cervical: No cervical adenopathy.  Skin:    General: Skin is warm.     Capillary Refill: Capillary refill takes less than 2 seconds.     Coloration: Skin is not pale.     Findings: No abrasion, erythema, petechiae or rash. Rash is not papular, urticarial or vesicular.  Neurological:     Mental Status: She is alert.  Psychiatric:        Behavior: Behavior is cooperative.      Diagnostic studies:    Allergy Studies:     Airborne Adult Perc - 05/26/22 0900     Time Antigen Placed 1610    Allergen Manufacturer Waynette Buttery    Location Back    Number of Test 59    1. Control-Buffer 50% Glycerol Negative    2. Control-Histamine 1 mg/ml 2+    3. Albumin saline Negative    4. Bahia Negative    5. French Southern Territ[place] Negative    6. Johnson Negative    7. Kentucky Blue Negative    8. Meadow Fescue Negative    9. Perennial Rye Negative    10. Sweet Vernal Negative    11. Timothy Negative    12. Cocklebur Negative    13. Burweed Marshelder Negative    14. Ragweed, short Negative    15. Ragweed, Giant Negative    16. Plantain,  English Negative  17. Lamb's Quarters Negative    18. Sheep Sorrell Negative    19. Rough Pigweed Negative    20. Marsh Elder, Rough Negative    21. Mugwort, Common Negative    22. Ash mix Negative    23. Birch mix Negative    24. Beech American Negative    25. Box, Elder Negative    26. Cedar, red Negative    27. Cottonwood, Guinea-Bissau Negative    28. Elm mix Negative    29. Hickory Negative    30. Maple mix Negative    31. Oak, Guinea[place] mix Negative    32. Pecan Pollen Negative    33. Pine mix Negative    34. Sycamore Eastern Negative    35. Walnut, Black Pollen Negative    36. Alternaria alternata Negative    37. Cladosporium Herbarum Negative    38. Aspergillus mix  Negative    39. Penicillium mix Negative    40. Bipolaris sorokiniana (Helminthosporium) Negative    41. Drechslera spicifera (Curvularia) Negative    42. Mucor plumbeus Negative    43. Fusarium moniliforme Negative    44. Aureobasidium pullulans (pullulara) Negative    45. Rhizopus oryzae Negative    46. Botrytis cinera Negative    47. Epicoccum nigrum Negative    48. Phoma betae Negative    49. Candida Albicans Negative    50. Trichophyton mentagrophytes Negative    51. Mite, D Farinae  5,000 AU/ml Negative    52. Mite, D Pteronyssinus  5,000 AU/ml Negative    53. Cat Hair 10,000 BAU/ml Negative    54.  Dog Epithelia Negative    55. Mixed Feathers Negative    56. Horse Epithelia Negative    57. Cockroach, German Negative    58. Mouse Negative    59. Tobacco Leaf Negative             Intradermal - 05/26/22 1017     Time Antigen Placed 1017    Allergen Manufacturer Waynette Buttery    Location Back    Number of Test 15    Control Omitted    French [place] Territ[place] Omitted    Bolivar Omitted    7 Grass Omitted    Ragweed mix Omitted    [employer] mix Omitted    Tree mix Omitted    Mold 1 Omitted    Mold 2 Omitted    Mold 3 Omitted    Mold 4 Omitted    Cat Omitted    Dog Omitted    Cockroach Omitted    Mite mix Omitted    Other Omitted   Pt. Declined Interdermals            Food Adult Perc - 05/26/22 0900     Time Antigen Placed 2440    Allergen Manufacturer Waynette Buttery    Location Back    Number of allergen test 27     Control-buffer 50% Glycerol Negative    Control-Histamine 1 mg/ml 2+    1. Peanut Negative    2. Soybean Negative    3. Wheat Negative    4. Sesame Negative    5. Milk, cow Negative    6. Egg White, Chicken Negative    7. Casein Negative    8. Shellfish Mix Negative    9. Fish Mix Negative    10. Cashew Negative    11. Pecan Food Negative    12. Walnut Food Negative    13. Almond Negative    14. Hazelnut Negative  15. [place] nut Negative    16. Coconut Negative     17. Pistachio Negative    18. Catfish Negative    19. Bass Negative    20. Trout Negative    21. Tuna Negative    22. Salmon Negative    23. Flounder Negative    24. Codfish Negative    25. Shrimp Negative    26. Crab Negative    27. Lobster Negative    28. Oyster Negative    29. Scallops Negative    42. Tomato Negative    63. Pineapple Negative             Allergy testing results were read and interpreted by myself, documented by clinical staff.         Malachi Bonds, MD [facility]

## 2022-05-26 NOTE — Patient Instructions (Addendum)
1. Chronic rhinitis - Testing today showed: negative to the entire panel (we will confirm with blood work) - Copy of test results provided.  - Avoidance measures provided. - Stop taking:  - Continue with: Singulair (montelukast) 10mg  daily, Flonase (fluticasone) one spray per nostril daily (AIM FOR EAR ON EACH SIDE), and Atrovent (ipratropium) 0.03% one spray per nostril 2-3 times daily as needed (CAN BE OVER DRYING) - You can use an extra dose of the antihistamine, if needed, for breakthrough symptoms.  - Consider nasal saline rinses 1-2 times daily to remove allergens from the nasal cavities as well as help with mucous clearance (this is especially helpful to do before the nasal sprays are given) - We can discuss more management ideas once we get the blood work back.   2. Allergic reaction - Testing to the requested foods was negative. - There is a the low positive predictive value of food allergy testing and hence the high possibility of false positives. - In contrast, food allergy testing has a high negative predictive value, therefore if testing is negative we can be relatively assured that they are indeed negative.  - These could be intolerances as well, which are not as severe and do not require the need for an EpiPen.   3. Allergic contact dermatitis - We are going to schedule you for patch testing. - this looks for chemical sensitivities including fragrance.  - Bring some samples of different bandaids with different adhesives so we can narrow down which one causes the worse reactions.  - These are placed on Mondays and then read on Wednesdays and Fridays.  4. Return in about 2 weeks (around 06/09/2022) for [facility] TESTING.    Please inform [facility] of any Emergency Department visits, hospitalizations, or changes in symptoms. Call us before going to the ED for breathing or allergy symptoms since we might be able to fit you in for a sick visit. Feel free to contact us anytime with any  questions, problems, or concerns.  It was a pleasure to meet you today! You are a hoot!   Websites that have reliable patient information: 1. American Academy of Asthma, Allergy, and Immunology: www.aaaai.org 2. Food Allergy Research and Education (FARE): foodallergy.org 3. Mothers of Asthmatics: http://www.asthmacommunitynetwork.org 4. American College of Allergy, Asthma, and Immunology: www.acaai.org   COVID-19 Vaccine Information can be found at: [place] For questions related to vaccine distribution or appointments, please email [email or web address] or call [phone].   We realize that you might be concerned about having an allergic reaction to the COVID19 vaccines. To help with that concern, WE ARE OFFERING THE COVID19 VACCINES IN OUR OFFICE! Ask the front desk for dates!     "Like" [phone] on Facebook and Instagram for our latest updates!      A healthy democracy works best when [place] participate! Make sure you are registered to vote! If you have moved or changed any of your contact information, you will need to get this updated before voting!  In some cases, you MAY be able to register to vote online: [employer]     True Test looks for the following sensitivities:      Airborne Adult Perc - 05/26/22 0900     Time Antigen Placed 05/28/22    Allergen Manufacturer [id number]    Location Back    Number of Test 59    1. Control-Buffer 50% Glycerol Negative    2. Control-Histamine 1 mg/ml 2+    3. Albumin saline Negative  4. Bahia Negative    5. [place] Negative    6. Johnson Negative    7. Kentucky Blue Negative    8. Meadow Fescue Negative    9. Perennial Rye Negative    10. Sweet Vernal Negative    11. Timothy Negative    12. Cocklebur Negative    13. Burweed Marshelder Negative    14. Ragweed, short Negative    15. Ragweed, Giant Negative    16. Plantain,   English Negative    17. Lamb's Quarters Negative    18. Sheep Sorrell Negative    19. Rough Pigweed Negative    20. Marsh Elder, Rough Negative    21. Mugwort, Common Negative    22. Ash mix Negative    23. Birch mix Negative    24. Beech American Negative    25. Box, Elder Negative    26. Cedar, red Negative    27. Cottonwood, Guinea[place] Negative    28. Elm mix Negative    29. Hickory Negative    30. Maple mix Negative    31. Oak, [place] mix Negative    32. Pecan Pollen Negative    33. Pine mix Negative    34. Sycamore Eastern Negative    35. Walnut, Black Pollen Negative    36. Alternaria alternata Negative    37. Cladosporium Herbarum Negative    38. Aspergillus mix Negative    39. Penicillium mix Negative    40. Bipolaris sorokiniana (Helminthosporium) Negative    41. Drechslera spicifera (Curvularia) Negative    42. Mucor plumbeus Negative    43. Fusarium moniliforme Negative    44. Aureobasidium pullulans (pullulara) Negative    45. Rhizopus oryzae Negative    46. Botrytis cinera Negative    47. Epicoccum nigrum Negative    48. Phoma betae Negative    49. Candida Albicans Negative    50. Trichophyton mentagrophytes Negative    51. Mite, D Farinae  5,000 AU/ml Negative    52. Mite, D Pteronyssinus  5,000 AU/ml Negative    53. Cat Hair 10,000 BAU/ml Negative    54.  Dog Epithelia Negative    55. Mixed Feathers Negative    56. Horse Epithelia Negative    57. Cockroach, German Negative    58. Mouse Negative    59. Tobacco Leaf Negative                 Food Adult Perc - 05/26/22 0900     Time Antigen Placed 8588    Allergen Manufacturer Waynette Buttery    Location Back    Number of allergen test 27     Control-buffer 50% Glycerol Negative    Control-Histamine 1 mg/ml 2+    1. Peanut Negative    2. Soybean Negative    3. Wheat Negative    4. Sesame Negative    5. Milk, cow Negative    6. Egg White, Chicken Negative    7. Casein Negative    8. Shellfish Mix  Negative    9. Fish Mix Negative    10. Cashew Negative    11. Pecan Food Negative    12. Walnut Food Negative    13. Almond Negative    14. Hazelnut Negative    15. [place] nut Negative    16. Coconut Negative    17. Pistachio Negative    18. Catfish Negative    19. Bass Negative    20. Trout Negative    21. Tuna Negative  22. Salmon Negative    23. Flounder Negative    24. Codfish Negative    25. Shrimp Negative    26. Crab Negative    27. Lobster Negative    28. Oyster Negative    29. Scallops Negative    42. Tomato Negative    63. Pineapple Negative                 Food Intolerance Versus Food Allergy  Food IntoleranceSome of the symptoms of food intolerance and food allergy are similar, but the differences between the two are very important. Eating a food you are intolerant to can leave you feeling miserable. However, if you have a true food allergy, your body's reaction to this food could be life-threatening.  Digestive system versus immune system  A food intolerance response takes place in the digestive system. It occurs when you are unable to properly breakdown the food. This could be due to enzyme deficiencies, sensitivity to food additives or reactions to naturally occurring chemicals in foods. Often, people can eat small amounts of the food without causing problems.  A food allergic reaction involves the immune system. Your immune system controls how your body defends itself. For instance, if you have an allergy to cow's milk, your immune system identifies cow's milk as an invader or allergen. Your immune system overreacts by producing antibodies called Immunoglobulin E (IgE). These antibodies travel to cells that release chemicals, causing an allergic reaction. Each type of IgE has a specific "radar" for each type of allergen.  Unlike an intolerance to food, a food allergy can cause a serious or even life-threatening reaction by eating a microscopic amount,  touching or inhaling the food.  Symptoms of allergic reactions to foods are generally seen on the skin (hives, itchiness, swelling of the skin). Gastrointestinal symptoms may include vomiting and diarrhea. Respiratory symptoms may accompany skin and gastrointestinal symptoms, but don't usually occur alone.  Anaphylaxis (pronounced an-a-fi-LAK-sis) is a serious allergic reaction that happens very quickly. Symptoms of anaphylaxis may include difficulty breathing, dizziness or loss of consciousness. Without immediate treatment--an injection of epinephrine (adrenalin) and expert care--anaphylaxis can be fatal.  To the Point: there is a very serious difference between being intolerant to a food and having a food allergy.     Regarding Food IgG Testing: In IgG testing, the blood is tested for IgG antibodies instead of being tested for IgE antibodies (i.e., the antibodies typically associated with food allergies). The existence of serum IgG antibodies towards particular foods is claimed by many practitioners as a tool to diagnose food allergy or intolerance. The problem with this is that IgG is a "memory antibody." IgG signifies exposure to a food, not allergy to a food. Since a normal immune system should make IgG antibodies to foreign proteins, a positive IgG test to a food is a sign of a normal immune system. In fact, a positive result can actually indicate tolerance for the food, not intolerance. There is no scientific evidence to support IgG testing for the diagnosis of food allergies.

## 2022-05-27 ENCOUNTER — Encounter: Payer: Self-pay | Admitting: Allergy & Immunology

## 2022-05-30 LAB — ALLERGENS W/COMP RFLX AREA 2
Alternaria Alternata IgE: <0.1 kU/L
Aspergillus Fumigatus IgE: <0.1 kU/L
Bermuda Grass IgE: <0.1 kU/L
Cedar, Mountain IgE: <0.1 kU/L
Cladosporium Herbarum IgE: <0.1 kU/L
Cockroach, German IgE: <0.1 kU/L
Common Silver Birch IgE: <0.1 kU/L
Cottonwood IgE: <0.1 kU/L
D Farinae IgE: <0.1 kU/L
D Pteronyssinus IgE: <0.1 kU/L
E001-IgE Cat Dander: <0.1 kU/L
E005-IgE Dog Dander: <0.1 kU/L
Elm, American IgE: <0.1 kU/L
IgE (Immunoglobulin E), Serum: 103 IU/mL (ref 6–495)
Johnson Grass IgE: <0.1 kU/L
Maple/Box Elder IgE: <0.1 kU/L
Mouse Urine IgE: <0.1 kU/L
Oak, White IgE: <0.1 kU/L
Pecan, Hickory IgE: <0.1 kU/L
Penicillium Chrysogen IgE: <0.1 kU/L
Pigweed, Rough IgE: <0.1 kU/L
Ragweed, Short IgE: <0.1 kU/L
Sheep Sorrel IgE Qn: <0.1 kU/L
Timothy Grass IgE: <0.1 kU/L
White Mulberry IgE: <0.1 kU/L

## 2022-05-30 LAB — ALLERGEN PROFILE, MOLD
Aureobasidi Pullulans IgE: <0.1 kU/L
Candida Albicans IgE: 0.12 kU/L — AB
M009-IgE Fusarium proliferatum: <0.1 kU/L
M014-IgE Epicoccum purpur: <0.1 kU/L
Mucor Racemosus IgE: <0.1 kU/L
Phoma Betae IgE: <0.1 kU/L
Setomelanomma Rostrat: <0.1 kU/L
Stemphylium Herbarum IgE: <0.1 kU/L

## 2022-05-30 LAB — TRYPTASE: Tryptase: 6.5 ug/L (ref 2.2–13.2)

## 2022-05-30 LAB — C-REACTIVE PROTEIN: CRP: 2 mg/L (ref 0–10)

## 2022-05-30 LAB — ANTINUCLEAR ANTIBODIES, IFA: ANA Titer 1: NEGATIVE

## 2022-05-30 LAB — SEDIMENTATION RATE: Sed Rate: 18 mm/hr (ref 0–40)

## 2022-06-26 NOTE — Progress Notes (Signed)
   Follow Up Note  RE: BRESLYN ABDO MRN: [id number] DOB: [birth year] Date of Office Visit: 06/27/2022  Referring provider: Diamantina Providence, FNP Primary care provider: Diamantina Providence, FNP  History of Present Illness: I had the pleasure of seeing Mathea Frieling for a follow up visit at the [facility] on 06/27/2022. She is a 61 y.o. female, who is being followed for dermatitis, chronic rhinitis and allergic reactions. Today she is here for patch test placement, given suspected history of contact dermatitis.   Diagnostics: TRUE Test patches placed.   Assessment and Plan: Adrienna is a 61 y.o. female with: Allergic contact dermatitis due to other agents TRUE Patches placed today. No additional taping was used due to patient's concerns about the adhesives.  Please avoid strenuous physical activities and do not get the patches on the back wet. No showering until final patch reading done. Okay to take antihistamines for itching but avoid placing any creams on the back where the patches are. We will remove the patches on Wednesday and will do our initial read. Then you will come back on Friday for a final read.  It was my pleasure to see Alexandra today and participate in her care. Please feel free to contact me with any questions or concerns.  Sincerely,  Wyline Mood, DO Allergy & Immunology  [facility] office: [phone] [facility] office: [phone] [place] office: [phone]

## 2022-06-27 ENCOUNTER — Ambulatory Visit (INDEPENDENT_AMBULATORY_CARE_PROVIDER_SITE_OTHER): Payer: Medicaid Other | Admitting: Allergy

## 2022-06-27 ENCOUNTER — Encounter: Payer: Self-pay | Admitting: Allergy

## 2022-06-27 VITALS — BP 150/80 | HR 77 | Temp 98.2°F | Resp 20 | Wt 179.8 lb

## 2022-06-27 DIAGNOSIS — L2389 Allergic contact dermatitis due to other agents: Secondary | ICD-10-CM | POA: Diagnosis not present

## 2022-06-27 NOTE — Patient Instructions (Signed)
Patches placed today. Please avoid strenuous physical activities and do not get the patches on the back wet. No showering until final patch reading done. Okay to take antihistamines for itching but avoid placing any creams on the back where the patches are. We will remove the patches on Wednesday and will do our initial read. Then you will come back on Friday for a final read. 

## 2022-06-27 NOTE — Assessment & Plan Note (Signed)
.   TRUE Patches placed today. o No additional taping was used due to patient's concerns about the adhesives.  . Please avoid strenuous physical activities and do not get the patches on the back wet. No showering until final patch reading done. Molli Knock to take antihistamines for itching but avoid placing any creams on the back where the patches are. . We will remove the patches on Wednesday and will do our initial read. . Then you will come back on Friday for a final read.

## 2022-06-28 NOTE — Progress Notes (Unsigned)
   Follow Up Note  RE: [name] MRN: [id number] DOB: 28-Jul-1961 Date of Office Visit: 06/29/2022  Referring provider: Diamantina Providence, FNP Primary care provider: Diamantina Providence, FNP  History of Present Illness: I had the pleasure of seeing Donna Simpson for a follow up visit at the [facility] on 06/29/2022. She is a 61 y.o. female, who is being followed for dermatitis. Today she is here for initial patch test interpretation, given suspected history of contact dermatitis.   Diagnostics:  TRUE TEST 48 hour reading:   T.R.U.E. Test - 06/29/22 1039     Time Antigen Placed 1039    Manufacturer Other    Lot # [id number]    Location Back    Number of Test 36    Reading Interval Day 1    Panel Panel 1;Panel 2;Panel 3    1. Nickel Sulfate 1    2. Wool Alcohols 0    3. Neomycin Sulfate 0    4. Potassium Dichromate 0    5. Caine Mix 2    6. Fragrance Mix 0    7. Colophony 0    8. Paraben Mix 0    9. Negative Control 0    10. Balsam of [place] 0    11. Ethylenediamine Dihydrochloride 0    12. Cobalt Dichloride 0    13. p-tert Butylphenol Formaldehyde Resin 0    14. Epoxy Resin 0    15. Carba Mix 0    16.  Black Rubber Mix 0    17. Cl+ Me-Isothiazolinone 0    18. Quaternium-15 0    19. Methyldibromo Glutaronitrile 0    20. p-Phenylenediamine 3    22. Mercapto Mix 0    23. Thimerosal 0    24. Thiuram Mix 0    25. Diazolidinyl Urea 0    26. Quinoline Mix 0    27. Tixocortol-21-Pivalate 0    28. Gold Sodium Thiosulfate 0    29. Imidazolidinyl Urea 0    30. Budesonide 0    31. Hydrocortisone-17-Butyrate 0    32. Mercaptobenzothiazole 0    33. Bacitracin 0    34. Parthenolide 0    35. Disperse Blue 106 0    36. 2-Bromo-2-Nitropropane-1,3-diol 0              Assessment and Plan: Donna Simpson is a 61 y.o. female with: Allergic contact dermatitis due to other agents TRUE Patches removed today. Positive to Nickel, caine mix and  p-Phenylenediamine. Handouts given. Return Friday for final read.   Return in about 2 days (around 07/01/2022) for Patch reading.  It was my pleasure to see Donna Simpson today and participate in her care. Please feel free to contact me with any questions or concerns.  Sincerely,  Wyline Mood, DO Allergy & Immunology  [facility] office: [phone] [facility] office: [phone] [place] office: [phone]

## 2022-06-29 ENCOUNTER — Encounter: Payer: Self-pay | Admitting: Allergy

## 2022-06-29 ENCOUNTER — Ambulatory Visit: Payer: Medicaid Other | Admitting: Allergy

## 2022-06-29 DIAGNOSIS — L2389 Allergic contact dermatitis due to other agents: Secondary | ICD-10-CM

## 2022-06-29 NOTE — Patient Instructions (Signed)
Patches removed today.  Follow up on Friday for a final read.

## 2022-06-29 NOTE — Assessment & Plan Note (Signed)
TRUE Patches removed today.  Positive to Nickel, caine mix and p-Phenylenediamine.  Handouts given.  Return Friday for final read.

## 2022-07-01 ENCOUNTER — Ambulatory Visit: Payer: Medicaid Other | Admitting: Internal Medicine

## 2022-07-01 DIAGNOSIS — L2389 Allergic contact dermatitis due to other agents: Secondary | ICD-10-CM

## 2022-07-01 NOTE — Patient Instructions (Signed)
Positive to Nickel, caine mix and p-Phenylenediamine, and Bacitracin Remove all these products from your environment  Consider low nickel diet   Follow up in 3 months for allergies and concern for food allergies    Thank you so much for letting me partake in your care today.  Don't hesitate to reach out if you have any additional concerns!  Ferol Luz, MD  [facility] [place] [place]

## 2022-07-01 NOTE — Progress Notes (Signed)
   Follow Up Note  RE: Donna Simpson MRN: [id number] DOB: 1960-11-17 Date of Office Visit: 07/01/2022  Referring provider: Diamantina Providence, FNP Primary care provider: Diamantina Providence, FNP  History of Present Illness: I had the pleasure of seeing Donna Simpson for a follow up visit at the [facility] [place] on 07/01/2022. She is a 61 y.o. female, who is being followed for dermatitis. Today she is here for final patch test interpretation, given suspected history of contact dermatitis.   Diagnostics:  TRUE TEST 96 hour reading:  48 Hr Read: Positive to Nickel, caine mix and p-Phenylenediamine 96Hr Read: Positive to Nickel, caine mix and p-Phenylenediamine, and Bacitracin    Assessment and Plan: Donna Simpson is a 61 y.o. female with: Concern for Contact Dermatitis:  The patient has been provided detailed information regarding the substances she is sensitive to, as well as products containing the substances.  Meticulous avoidance of these substances is recommended. If avoidance is not possible, the use of barrier creams or lotions is recommended. If symptoms persist or progress despite meticulous avoidance of chemicals/substances above, dermatology evaluation may be warranted. No follow-ups on file.  It was my pleasure to see Donna Simpson today and participate in her care. Please feel free to contact me with any questions or concerns.  Sincerely,   Ferol Luz, MD [facility] [place]

## 2022-09-08 ENCOUNTER — Ambulatory Visit (HOSPITAL_COMMUNITY)
Admission: EM | Admit: 2022-09-08 | Discharge: 2022-09-08 | Disposition: A | Discharge status: Home / Self Care | Payer: Medicaid Other | Attending: Physician Assistant | Admitting: Physician Assistant

## 2022-09-08 ENCOUNTER — Encounter (HOSPITAL_COMMUNITY): Payer: Self-pay

## 2022-09-08 DIAGNOSIS — J40 Bronchitis, not specified as acute or chronic: Secondary | ICD-10-CM

## 2022-09-08 DIAGNOSIS — J9801 Acute bronchospasm: Secondary | ICD-10-CM | POA: Diagnosis not present

## 2022-09-08 DIAGNOSIS — J329 Chronic sinusitis, unspecified: Secondary | ICD-10-CM | POA: Diagnosis not present

## 2022-09-08 MED ORDER — FLUCONAZOLE 150 MG PO TABS: 150.0000 mg | ORAL_TABLET | Freq: Once | ORAL | 0 refills | Status: AC

## 2022-09-08 MED ORDER — ALBUTEROL SULFATE HFA 108 (90 BASE) MCG/ACT IN AERS: 1.0000 | INHALATION_SPRAY | Freq: Four times a day (QID) | RESPIRATORY_TRACT | 0 refills | Status: DC | PRN

## 2022-09-08 MED ORDER — IPRATROPIUM-ALBUTEROL 0.5-2.5 (3) MG/3ML IN SOLN
3.0000 mL | Freq: Once | RESPIRATORY_TRACT | Status: AC
  Administered 2022-09-08: 3 mL via RESPIRATORY_TRACT

## 2022-09-08 MED ORDER — IPRATROPIUM-ALBUTEROL 0.5-2.5 (3) MG/3ML IN SOLN
RESPIRATORY_TRACT | Status: AC
  Filled 2022-09-08: qty 3

## 2022-09-08 MED ORDER — PREDNISONE 20 MG PO TABS: 40.0000 mg | ORAL_TABLET | Freq: Every day | ORAL | 0 refills | Status: AC

## 2022-09-08 MED ORDER — AMOXICILLIN-POT CLAVULANATE 875-125 MG PO TABS: 1.0000 | ORAL_TABLET | Freq: Two times a day (BID) | ORAL | 0 refills | Status: DC

## 2022-09-08 NOTE — ED Provider Notes (Signed)
[facility]    CSN: [id number] Arrival date & time: 09/08/22  0935      History   Chief Complaint Chief Complaint  Patient presents with   Nasal Congestion   Sinusitis    HPI Donna Simpson is a 61 y.o. female.   Patient presents today with a week plus long history of URI symptoms that have worsened in the past 24 hours prompting evaluation.  Reports significant congestion, cough, shortness of breath, wheezing.  Denies any fever, nausea, vomiting, chest pain.  She has tried over-the-counter medications including multisymptom medication without improvement of symptoms.  Denies any known sick contacts.  She denies formal diagnosis of asthma, allergy, COPD but does tend to get bronchitis or URI annually.  She is a smoker.  Denies any recent antibiotic or steroid use.  Denies history of diabetes.  She is having difficulty with daily activities as a result of symptoms.    Past Medical History:  Diagnosis Date   Anxiety    Anxiety    Atypical chest pain 02/03/2017   Essential hypertension 02/03/2017   Hyperlipidemia 02/03/2017   Hypertension    Kidney stone    Leg cramping 02/03/2017   Tobacco abuse 03/10/2017    Patient Active Problem List   Diagnosis Date Noted   Allergic contact dermatitis due to other agents 06/27/2022   Body mass index (BMI) 30.0-30.9, adult 05/25/2022   Thyroid nodule 05/25/2022   Allergic reaction to substance 03/30/2022   Tobacco dependence syndrome 09/17/2021   Acute stress disorder 09/08/2021   Insomnia 09/08/2021   Low back pain 09/08/2021   Vitamin B12 deficiency (non anemic) 09/08/2021   Hyperlipoproteinemia 04/29/2021   Hypercholesterolemia 04/29/2021   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   Onychomycosis 04/21/2021   Seasonal allergic rhinitis 04/21/2021   Vitamin D deficiency 04/21/2021   Family history of cardiomyopathy 06/05/2019   Tobacco abuse 03/10/2017   Essential hypertension 02/03/2017   Leg  cramping 02/03/2017   Atypical chest pain 02/03/2017    Past Surgical History:  Procedure Laterality Date   BACK SURGERY     Spinal fusion   BREAST SURGERY     Biopsy--benign   FOOT SURGERY     PELVIC LAPAROSCOPY     SPINAL FUSION     VAGINAL HYSTERECTOMY      OB History     Gravida  2   Para  2   Term  2   Preterm      AB      Living  2      SAB      IAB      Ectopic      Multiple      Live Births               Home Medications    Prior to Admission medications   Medication Sig Start Date End Date Taking? Authorizing Provider  albuterol (VENTOLIN HFA) 108 (90 Base) MCG/ACT inhaler Inhale 1-2 puffs into the lungs every 6 (six) hours as needed for wheezing or shortness of breath. 09/08/22  Yes Carynn Felling K, PA-C  amoxicillin-clavulanate (AUGMENTIN) 875-125 MG tablet Take 1 tablet by mouth every 12 (twelve) hours. 09/08/22  Yes Zacariah Belue K, PA-C  fluconazole (DIFLUCAN) 150 MG tablet Take 1 tablet (150 mg total) by mouth once for 1 dose. 09/08/22 09/08/22 Yes Nillie Bartolotta, Denny Peon K, PA-C  predniSONE (DELTASONE) 20 MG tablet Take 2 tablets (40 mg total) by mouth daily for  5 days. 09/08/22 09/13/22 Yes Hero Kulish K, PA-C  Calcium Carbonate-Vitamin D (CALCIUM + D PO) Take by mouth.    [provider]  cyanocobalamin (,VITAMIN B-12,) 1000 MCG/ML injection Inject 1,000 mcg into the skin every 14 (fourteen) days. 04/04/22   [provider]  ergocalciferol (VITAMIN D2) 1.25 MG (50000 UT) capsule 1 capsule    [provider]  fluticasone (FLONASE) 50 MCG/ACT nasal spray Place 1-2 sprays into both nostrils daily for 7 days. 02/20/20 02/27/20  Wieters, Hallie C, PA-C  hydrochlorothiazide (MICROZIDE) 12.5 MG capsule Take 12.5 mg by mouth daily.    [provider]  hydrOXYzine (ATARAX) 25 MG tablet Take 1 tablet (25 mg total) by mouth every 8 (eight) hours as needed. 03/17/22   Merrilee Jansky, MD  ipratropium (ATROVENT) 0.03 % nasal spray  SMARTSIG:1-2 Spray(s) Both Nares 1 to 2 Times Daily 04/06/20   [provider]  methocarbamol (ROBAXIN) 750 MG tablet methocarbamol 750 mg tablet  TAKE 1 TABLET BY MOUTH THREE TIMES DAILY FOR 20 DAYS AS NEEDED    [provider]  montelukast (SINGULAIR) 10 MG tablet Take 1 tablet by mouth as needed. 01/02/17   [provider]  NONFORMULARY OR COMPOUNDED ITEM Estradiol vaginal cream 0.02% insert 2 grams nightly x 2 weeks, then decrease to 2 grams every other night x 2 weeks, then 2 grams twice weekly 04/19/21   Wyline Beady A, NP  traMADol (ULTRAM) 50 MG tablet tramadol 50 mg tablet  TAKE 1 TABLET BY MOUTH THREE TIMES DAILY FOR 10 DAYS AS DIRECTED    [provider]  Vitamin D, Ergocalciferol, (DRISDOL) 1.25 MG (50000 UNIT) CAPS capsule Take 50,000 Units by mouth once a week. 12/04/20   [provider]  VOLTAREN 1 % GEL Apply 1 application topically as needed. 01/27/17   [provider]    Family History Family History  Problem Relation Age of Onset   Cancer Mother        Lung   Hypertension Father    Heart attack Father    Heart failure Father    Hypertension Sister    Hypertension Brother    Diabetes Brother    Congenital heart disease Brother    Valvular heart disease Brother    Heart failure Brother     Social History Social History   Tobacco Use   Smoking status: Every Day    Packs/day: 0.50    Types: Cigarettes   Smokeless tobacco: Never  Vaping Use   Vaping Use: Never used  Substance Use Topics   Alcohol use: Yes    Alcohol/week: 0.0 standard drinks of alcohol    Comment: Rare   Drug use: No     Allergies   Latex   Review of Systems Review of Systems  Constitutional:  Positive for activity change. Negative for appetite change, fatigue and fever.  HENT:  Positive for congestion and sinus pressure. Negative for sneezing and sore throat.   Respiratory:  Positive for cough, shortness of breath and wheezing.  Negative for chest tightness.   Cardiovascular:  Negative for chest pain.  Gastrointestinal:  Negative for abdominal pain, diarrhea, nausea and vomiting.  Neurological:  Negative for dizziness, light-headedness and headaches.     Physical Exam Triage Vital Signs ED Triage Vitals [09/08/22 1044]  Enc Vitals Group     BP (!) 145/88     Pulse Rate 82     Resp 16     Temp 98.4 F (36.9  C)     Temp Source Oral     SpO2 100 %     Weight      Height      Head Circumference      Peak Flow      Pain Score      Pain Loc      Pain Edu?      Excl. in GC?    No data found.  Updated Vital Signs BP (!) 145/88 (BP Location: Left Arm)   Pulse 82   Temp 98.4 F (36.9 C) (Oral)   Resp 16   SpO2 100%   Visual Acuity Right Eye Distance:   Left Eye Distance:   Bilateral Distance:    Right Eye Near:   Left Eye Near:    Bilateral Near:     Physical Exam Vitals reviewed.  Constitutional:      General: She is awake. She is not in acute distress.    Appearance: Normal appearance. She is well-developed. She is not ill-appearing.     Comments: Very pleasant female appears stated age in no acute distress sitting comfortably on exam room table  HENT:     Head: Normocephalic and atraumatic.     Right Ear: Tympanic membrane, ear canal and external ear normal. Tympanic membrane is not erythematous or bulging.     Left Ear: Tympanic membrane, ear canal and external ear normal. Tympanic membrane is not erythematous or bulging.     Nose:     Right Sinus: Frontal sinus tenderness present. No maxillary sinus tenderness.     Left Sinus: Frontal sinus tenderness present. No maxillary sinus tenderness.     Mouth/Throat:     Pharynx: Uvula midline. No oropharyngeal exudate or posterior oropharyngeal erythema.  Cardiovascular:     Rate and Rhythm: Normal rate and regular rhythm.     Heart sounds: Normal heart sounds, S1 normal and S2 normal. No murmur heard. Pulmonary:     Effort: Pulmonary  effort is normal.     Breath sounds: Wheezing and rhonchi present. No rales.     Comments: Scattered wheezing and rhonchi partially clear with cough Psychiatric:        Behavior: Behavior is cooperative.      UC Treatments / Results  Labs (all labs ordered are listed, but only abnormal results are displayed) Labs Reviewed - No data to display  EKG   Radiology No results found.  Procedures Procedures (including critical care time)  Medications Ordered in UC Medications  ipratropium-albuterol (DUONEB) 0.5-2.5 (3) MG/3ML nebulizer solution 3 mL (3 mLs Nebulization Given 09/08/22 1159)    Initial Impression / Assessment and Plan / UC Course  I have reviewed the triage vital signs and the nursing notes.  Pertinent labs & imaging results that were available during my care of the patient were reviewed by me and considered in my medical decision making (see chart for details).     Patient is well-appearing, afebrile, nontoxic, nontachycardic.  She was given DuoNeb in clinic with improvement of symptoms.  Given prolonged and worsening symptoms concern for secondary bacterial infection.  She was started on Augmentin twice daily for 7 days.  Patient requests Diflucan with antibiotic use as she often gets yeast infection.  This was sent to pharmacy per her request.  No indication for viral testing as patient has been symptomatic for over a week.  She is to start prednisone 40 mg daily.  Discussed that she is not to take NSAIDs with this  medication due to risk of GI bleeding.  Albuterol inhaler was sent to pharmacy.  She was encouraged to rest and drink plenty fluid.  Can use over-the-counter medication including Mucinex, Flonase, Tylenol for additional symptom relief.  If symptoms are not improving within a week she is to return for reevaluation.  If she has any worsening symptoms she needs to be seen immediately.  Strict return precautions given.  Work excuse note provided.  Final Clinical  Impressions(s) / UC Diagnoses   Final diagnoses:  Sinobronchitis  Bronchospasm     Discharge Instructions      Start Augmentin twice daily for 7 days to cover for infection.  Take prednisone 40 mg for 5 days.  Do not take NSAIDs with this medication including aspirin, ibuprofen/Advil, naproxen/Aleve.  Use albuterol every 4-6 hours as needed.  Use Mucinex, Flonase, Tylenol for symptom relief.  Make sure you rest and drink plenty of fluid.  If symptoms do not improve by next week please return for reevaluation.  If anything worsens you need to be seen immediately.     ED Prescriptions     Medication Sig Dispense Auth. Provider   amoxicillin-clavulanate (AUGMENTIN) 875-125 MG tablet Take 1 tablet by mouth every 12 (twelve) hours. 14 tablet Brailon Don K, PA-C   albuterol (VENTOLIN HFA) 108 (90 Base) MCG/ACT inhaler Inhale 1-2 puffs into the lungs every 6 (six) hours as needed for wheezing or shortness of breath. 18 g Daesha Insco K, PA-C   predniSONE (DELTASONE) 20 MG tablet Take 2 tablets (40 mg total) by mouth daily for 5 days. 10 tablet Lotoya Casella K, PA-C   fluconazole (DIFLUCAN) 150 MG tablet Take 1 tablet (150 mg total) by mouth once for 1 dose. 1 tablet Tyran Huser, Derry Skill, PA-C      PDMP not reviewed this encounter.   Terrilee Croak, PA-C 09/08/22 1206

## 2022-09-08 NOTE — ED Triage Notes (Signed)
Pt presents to the office for sinus pressure and pain x 2-3 days.

## 2022-09-08 NOTE — Discharge Instructions (Signed)
Start Augmentin twice daily for 7 days to cover for infection.  Take prednisone 40 mg for 5 days.  Do not take NSAIDs with this medication including aspirin, ibuprofen/Advil, naproxen/Aleve.  Use albuterol every 4-6 hours as needed.  Use Mucinex, Flonase, Tylenol for symptom relief.  Make sure you rest and drink plenty of fluid.  If symptoms do not improve by next week please return for reevaluation.  If anything worsens you need to be seen immediately.

## 2022-10-02 NOTE — Progress Notes (Unsigned)
Follow Up Note  RE: Donna Simpson MRN: [id number] DOB: 1961/10/07 Date of Office Visit: 10/03/2022  Referring provider: Diamantina Providence, FNP Primary care provider: Diamantina Providence, FNP  Chief Complaint: No chief complaint on file.  History of Present Illness: I had the pleasure of seeing Donna Simpson for a follow up visit at the [facility] on 10/02/2022. She is a 61 y.o. female, who is being followed for allergic reaction, rhinitis and contact dermatitis. Her previous allergy office visit was on 07/01/2022 with Dr. Marlynn Perking. Today is a regular follow up visit.  Positive to Nickel, caine mix and p-Phenylenediamine, and Bacitracin  Can someone call the patient to let her know that her labs were largely normal? She had a mild allergy to a mold, but otherwise was negative to the environmental allergy panel. We did a tryptase and this was negative (ruling out a mast cell disease). Autoimmune workup including ANA and inflammatory markers were all normal.   We could do intradermal testing, but I do not think that she was a fan of that idea at the last visit. How are her medications working? Any additional allergic reactions?  Allergic contact dermatitis due to other agents TRUE Patches placed today. No additional taping was used due to patient's concerns about the adhesives.  Please avoid strenuous physical activities and do not get the patches on the back wet. No showering until final patch reading done. Okay to take antihistamines for itching but avoid placing any creams on the back where the patches are. We will remove the patches on Wednesday and will do our initial read. Then you will come back on Friday for a final read.   It was my pleasure to see Donna Simpson today and participate in her care. Please feel free to contact me with any questions or concerns.  1. Chronic rhinitis - Testing today showed: negative to the entire panel (we will confirm with blood  work) - Copy of test results provided.  - Avoidance measures provided. - Stop taking:  - Continue with: Singulair (montelukast) 10mg  daily, Flonase (fluticasone) one spray per nostril daily (AIM FOR EAR ON EACH SIDE), and Atrovent (ipratropium) 0.03% one spray per nostril 2-3 times daily as needed (CAN BE OVER DRYING) - You can use an extra dose of the antihistamine, if needed, for breakthrough symptoms.  - Consider nasal saline rinses 1-2 times daily to remove allergens from the nasal cavities as well as help with mucous clearance (this is especially helpful to do before the nasal sprays are given) - We can discuss more management ideas once we get the blood work back.    2. Allergic reaction - Testing to the requested foods was negative. - There is a the low positive predictive value of food allergy testing and hence the high possibility of false positives. - In contrast, food allergy testing has a high negative predictive value, therefore if testing is negative we can be relatively assured that they are indeed negative.  - These could be intolerances as well, which are not as severe and do not require the need for an EpiPen.    3. Allergic contact dermatitis - We are going to schedule you for patch testing. - this looks for chemical sensitivities including fragrance.  - Bring some samples of different bandaids with different adhesives so we can narrow down which one causes the worse reactions.  - These are placed on Mondays and then read on Wednesdays and Fridays.  Assessment and Plan: Donna Simpson is a 61 y.o. female with: No problem-specific Assessment & Plan notes found for this encounter.  No follow-ups on file.  No orders of the defined types were placed in this encounter.  Lab Orders  No laboratory test(s) ordered today    Diagnostics: Spirometry:  Tracings reviewed. Her effort: {Blank single:19197::"Good reproducible efforts.","It was hard to get consistent efforts and there is  a question as to whether this reflects a maximal maneuver.","Poor effort, data can not be interpreted."} FVC: ***L FEV1: ***L, ***% predicted FEV1/FVC ratio: ***% Interpretation: {Blank single:19197::"Spirometry consistent with mild obstructive disease","Spirometry consistent with moderate obstructive disease","Spirometry consistent with severe obstructive disease","Spirometry consistent with possible restrictive disease","Spirometry consistent with mixed obstructive and restrictive disease","Spirometry uninterpretable due to technique","Spirometry consistent with normal pattern","No overt abnormalities noted given today's efforts"}.  Please see scanned spirometry results for details.  Skin Testing: {Blank single:19197::"Select foods","Environmental allergy panel","Environmental allergy panel and select foods","Food allergy panel","None","Deferred due to recent antihistamines use"}. *** Results discussed with patient/family.   Medication List:  Current Outpatient Medications  Medication Sig Dispense Refill   albuterol (VENTOLIN HFA) 108 (90 Base) MCG/ACT inhaler Inhale 1-2 puffs into the lungs every 6 (six) hours as needed for wheezing or shortness of breath. 18 g 0   amoxicillin-clavulanate (AUGMENTIN) 875-125 MG tablet Take 1 tablet by mouth every 12 (twelve) hours. 14 tablet 0   Calcium Carbonate-Vitamin D (CALCIUM + D PO) Take by mouth.     cyanocobalamin (,VITAMIN B-12,) 1000 MCG/ML injection Inject 1,000 mcg into the skin every 14 (fourteen) days.     ergocalciferol (VITAMIN D2) 1.25 MG (50000 UT) capsule 1 capsule     fluticasone (FLONASE) 50 MCG/ACT nasal spray Place 1-2 sprays into both nostrils daily for 7 days. 1 g 0   hydrochlorothiazide (MICROZIDE) 12.5 MG capsule Take 12.5 mg by mouth daily.     hydrOXYzine (ATARAX) 25 MG tablet Take 1 tablet (25 mg total) by mouth every 8 (eight) hours as needed. 21 tablet 0   ipratropium (ATROVENT) 0.03 % nasal spray SMARTSIG:1-2 Spray(s) Both  Nares 1 to 2 Times Daily     methocarbamol (ROBAXIN) 750 MG tablet methocarbamol 750 mg tablet  TAKE 1 TABLET BY MOUTH THREE TIMES DAILY FOR 20 DAYS AS NEEDED     montelukast (SINGULAIR) 10 MG tablet Take 1 tablet by mouth as needed.  5   NONFORMULARY OR COMPOUNDED ITEM Estradiol vaginal cream 0.02% insert 2 grams nightly x 2 weeks, then decrease to 2 grams every other night x 2 weeks, then 2 grams twice weekly 90 each 3   traMADol (ULTRAM) 50 MG tablet tramadol 50 mg tablet  TAKE 1 TABLET BY MOUTH THREE TIMES DAILY FOR 10 DAYS AS DIRECTED     Vitamin D, Ergocalciferol, (DRISDOL) 1.25 MG (50000 UNIT) CAPS capsule Take 50,000 Units by mouth once a week.     VOLTAREN 1 % GEL Apply 1 application topically as needed.  2   No current facility-administered medications for this visit.   Allergies: Allergies  Allergen Reactions   Latex Itching, Swelling and Rash   I reviewed her past medical history, social history, family history, and environmental history and no significant changes have been reported from her previous visit.  Review of Systems  Constitutional:  Negative for appetite change, chills, fever and unexpected weight change.  HENT:  Negative for congestion and rhinorrhea.   Eyes:  Negative for itching.  Respiratory:  Negative for cough, chest tightness, shortness of breath and wheezing.  Cardiovascular:  Negative for chest pain.  Gastrointestinal:  Negative for abdominal pain.  Genitourinary:  Negative for difficulty urinating.  Skin:  Negative for rash.  Neurological:  Negative for headaches.    Objective: There were no vitals taken for this visit. There is no height or weight on file to calculate BMI. Physical Exam Vitals and nursing note reviewed.  Constitutional:      Appearance: Normal appearance. She is well-developed.  HENT:     Head: Normocephalic and atraumatic.     Right Ear: Tympanic membrane and external ear normal.     Left Ear: Tympanic membrane and external  ear normal.     Nose: Nose normal.     Mouth/Throat:     Mouth: Mucous membranes are moist.     Pharynx: Oropharynx is clear.  Eyes:     Conjunctiva/sclera: Conjunctivae normal.  Cardiovascular:     Rate and Rhythm: Normal rate and regular rhythm.     Heart sounds: Normal heart sounds. No murmur heard.    No friction rub. No gallop.  Pulmonary:     Effort: Pulmonary effort is normal.     Breath sounds: Normal breath sounds. No wheezing, rhonchi or rales.  Musculoskeletal:     Cervical back: Neck supple.  Skin:    General: Skin is warm.     Findings: No rash.  Neurological:     Mental Status: She is alert and oriented to person, place, and time.  Psychiatric:        Behavior: Behavior normal.    Previous notes and tests were reviewed. The plan was reviewed with the patient/family, and all questions/concerned were addressed.  It was my pleasure to see Donna Simpson today and participate in her care. Please feel free to contact me with any questions or concerns.  Sincerely,  Wyline Mood, DO Allergy & Immunology  [facility] office: [phone] [facility] office: [phone]

## 2022-10-03 ENCOUNTER — Ambulatory Visit (INDEPENDENT_AMBULATORY_CARE_PROVIDER_SITE_OTHER): Payer: Medicaid Other | Admitting: Allergy

## 2022-10-03 ENCOUNTER — Encounter: Payer: Self-pay | Admitting: Allergy

## 2022-10-03 VITALS — BP 140/90 | HR 77 | Temp 98.7°F | Resp 16 | Wt 178.4 lb

## 2022-10-03 DIAGNOSIS — R21 Rash and other nonspecific skin eruption: Secondary | ICD-10-CM

## 2022-10-03 DIAGNOSIS — L2389 Allergic contact dermatitis due to other agents: Secondary | ICD-10-CM

## 2022-10-03 DIAGNOSIS — L819 Disorder of pigmentation, unspecified: Secondary | ICD-10-CM

## 2022-10-03 DIAGNOSIS — J310 Chronic rhinitis: Secondary | ICD-10-CM | POA: Diagnosis not present

## 2022-10-03 DIAGNOSIS — T7840XD Allergy, unspecified, subsequent encounter: Secondary | ICD-10-CM

## 2022-10-03 MED ORDER — EUCRISA 2 % EX OINT: 1.0000 | TOPICAL_OINTMENT | Freq: Two times a day (BID) | CUTANEOUS | 2 refills | Status: DC | PRN

## 2022-10-03 MED ORDER — LEVOCETIRIZINE DIHYDROCHLORIDE 5 MG PO TABS: 5.0000 mg | ORAL_TABLET | Freq: Every day | ORAL | 2 refills | Status: DC | PRN

## 2022-10-03 NOTE — Assessment & Plan Note (Signed)
Past history - 2023 patch testing was positive to Nickel, caine mix, p-Phenylenediamine, and Bacitracin. See handout. Continue to avoid.

## 2022-10-03 NOTE — Patient Instructions (Addendum)
Contact dermatitis Positive to Nickel, caine mix, p-Phenylenediamine, and Bacitracin. See handout. Continue to avoid.  Negative to indoor/outdoor allergens - both on skin testing and bloodwork. Negative to select foods.   Skin discoloration/eczema Stop using topical steroid creams - as it can cause discoloration. Use Eucrisa (crisaborole) 2% ointment twice a day on mild rash flares on the face and body. This is a non-steroid ointment.  If it burns, place the medication in the refrigerator.  Apply a thin layer of moisturizer and then apply the Eucrisa on top of it. See below for proper skin care.    Itching Use over the counter antihistamines such as Zyrtec (cetirizine), Claritin (loratadine), Allegra (fexofenadine), or Xyzal (levocetirizine) daily as needed. May switch antihistamines every few months. Most likely the Emergen-C and soy are not causing these symptoms.  Chronic rhinitis Use Flonase (fluticasone) nasal spray 1 spray per nostril twice a day as needed for nasal congestion.  Nasal saline spray (i.e., Simply Saline) or nasal saline lavage (i.e., NeilMed) is recommended as needed and prior to medicated nasal sprays.  Follow up in 3-4 months or sooner if needed.  Skin care recommendations  Bath time: Always use lukewarm water. AVOID very hot or cold water. Keep bathing time to 5-10 minutes. Do NOT use bubble bath. Use a mild soap and use just enough to wash the dirty areas. Do NOT scrub skin vigorously.  After bathing, pat dry your skin with a towel. Do NOT rub or scrub the skin.  Moisturizers and prescriptions:  ALWAYS apply moisturizers immediately after bathing (within 3 minutes). This helps to lock-in moisture. Use the moisturizer several times a day over the whole body. Good summer moisturizers include: Aveeno, CeraVe, Cetaphil. Good winter moisturizers include: Aquaphor, Vaseline, Cerave, Cetaphil, Eucerin, Vanicream. When using moisturizers along with  medications, the moisturizer should be applied about one hour after applying the medication to prevent diluting effect of the medication or moisturize around where you applied the medications. When not using medications, the moisturizer can be continued twice daily as maintenance.  Laundry and clothing: Avoid laundry products with added color or perfumes. Use unscented hypo-allergenic laundry products such as Tide free, Cheer free & gentle, and All free and clear.  If the skin still seems dry or sensitive, you can try double-rinsing the clothes. Avoid tight or scratchy clothing such as wool. Do not use fabric softeners or dyer sheets.

## 2022-10-03 NOTE — Assessment & Plan Note (Signed)
Past history - 2023 skin testing and bloodwork was negative to indoor/outdoor allergens. Monitor symptoms.  Use Flonase (fluticasone) nasal spray 1 spray per nostril twice a day as needed for nasal congestion.  Nasal saline spray (i.e., Simply Saline) or nasal saline lavage (i.e., NeilMed) is recommended as needed and prior to medicated nasal sprays.

## 2022-10-03 NOTE — Assessment & Plan Note (Signed)
Mainly on upper arms. Using topical cream for the itching. Stop using topical steroid creams - as it can cause discoloration. Use Eucrisa (crisaborole) 2% ointment twice a day on mild rash flares on the face and body. This is a non-steroid ointment.  See below for proper skin care.   For the itching - Use over the counter antihistamines such as Zyrtec (cetirizine), Claritin (loratadine), Allegra (fexofenadine), or Xyzal (levocetirizine) daily as needed. May switch antihistamines every few months. It is unlikely that soy and Emergen-C is causing her symptoms as she stopped for 1 week with no benefit.

## 2022-10-04 ENCOUNTER — Telehealth: Payer: Self-pay

## 2022-10-04 NOTE — Telephone Encounter (Signed)
PA request received via CMM for Eucrisa 2% ointment through [facility].  PA has been submitted and APPROVED from 10/04/2022-10/04/2023.  KeyWylie Hail - PA Case ID: [id number]

## 2022-10-12 ENCOUNTER — Other Ambulatory Visit: Payer: Self-pay | Admitting: Nurse Practitioner

## 2022-10-12 DIAGNOSIS — Z1231 Encounter for screening mammogram for malignant neoplasm of breast: Secondary | ICD-10-CM

## 2022-12-07 ENCOUNTER — Ambulatory Visit
Admission: RE | Admit: 2022-12-07 | Discharge: 2022-12-07 | Disposition: A | Discharge status: Home / Self Care | Payer: Medicaid Other | Source: Ambulatory Visit | Attending: Nurse Practitioner | Admitting: Nurse Practitioner

## 2022-12-07 DIAGNOSIS — Z1231 Encounter for screening mammogram for malignant neoplasm of breast: Secondary | ICD-10-CM

## 2023-01-03 ENCOUNTER — Other Ambulatory Visit: Payer: Self-pay | Admitting: Allergy

## 2023-01-31 NOTE — Progress Notes (Signed)
Follow Up Note  RE: Donna Simpson MRN: [id number] DOB: 1961-03-18 Date of Office Visit: 02/01/2023  Referring provider: Diamantina Providence, FNP Primary care provider: Diamantina Providence, FNP  Chief Complaint: Allergic Rhinitis  (Pollen - sneezing ) and Rash (Aquaphor lotion has not been helping - blotching on her thighs, rashes in the creases of her arms - limited steroid cream uses due to light patches )  History of Present Illness: I had the pleasure of seeing Donna Simpson for a follow up visit at the [facility] on 02/01/2023. She is a 62 y.o. female, who is being followed for rash/hypopigmentation, contact dermatitis and chronic rhinitis. Her previous allergy office visit was on 10/03/2022 with Dr. Selena Batten. Today is a regular follow up visit.  Rash  Noted some itching/red rash on the antecubital fossa area and legs. Using [place] as needed.  Some dry skin on her shoulders but has been moisturizing. Notices that sometimes certain clothing materials seems to flare her symptoms.   Allergic contact dermatitis Avoiding items that was positive on patch testing.    Chronic rhinitis Been taking allegra as needed with good benefit.  Not started using any nasal sprays yet but the pollen does seem to bother her.   Assessment and Plan: Donna Simpson is a 62 y.o. female with: Rash/hypopigmentation Past history - Mainly on upper arms. Using topical cream for the itching. Interim history - uses topical Eucrisa and triamcinolone prn with good benefit. Use Eucrisa (crisaborole) 2% ointment twice a day on mild rash flares on the face and body. This is a non-steroid ointment.  Continue proper skin care.   Use triamcinolone 0.1% cream twice a day as needed for rash flares. Do not use on the face, neck, armpits or groin area. Do not use more than 3 weeks in a row.   Allergic contact dermatitis due to other agents Past history - 2023 patch testing was positive to Nickel, caine mix,  p-Phenylenediamine, and Bacitracin. Continue to avoid.  Chronic rhinitis Past history - 2023 skin testing and bloodwork was negative to indoor/outdoor allergens. Interim history - some symptoms with the tree pollen.  Use over the counter antihistamines such as Zyrtec (cetirizine), Claritin (loratadine), Allegra (fexofenadine), or Xyzal (levocetirizine) daily as needed. May switch antihistamines every few months. Get over the counter generic version at [facility]. Use Flonase (fluticasone) nasal spray 1 spray per nostril twice a day as needed for nasal congestion.  Nasal saline spray (i.e., Simply Saline) or nasal saline lavage (i.e., NeilMed) is recommended as needed and prior to medicated nasal sprays.  Return in about 6 months (around 08/04/2023).  Meds ordered this encounter  Medications   Crisaborole (EUCRISA) 2 % OINT    Sig: Apply 1 Application topically 2 (two) times daily as needed (mild rash).    Dispense:  60 g    Refill:  5   triamcinolone cream (KENALOG) 0.1 %    Sig: Apply 1 Application topically daily.    Dispense:  45 g    Refill:  3   Lab Orders  No laboratory test(s) ordered today    Diagnostics: None.   Medication List:  Current Outpatient Medications  Medication Sig Dispense Refill   albuterol (VENTOLIN HFA) 108 (90 Base) MCG/ACT inhaler Inhale 1-2 puffs into the lungs every 6 (six) hours as needed for wheezing or shortness of breath. 18 g 0   Calcium Carbonate-Vitamin D (CALCIUM + D PO) Take by mouth.  cyanocobalamin (,VITAMIN B-12,) 1000 MCG/ML injection Inject 1,000 mcg into the skin every 14 (fourteen) days.     ergocalciferol (VITAMIN D2) 1.25 MG (50000 UT) capsule 1 capsule     hydrochlorothiazide (MICROZIDE) 12.5 MG capsule Take 12.5 mg by mouth daily.     levocetirizine (XYZAL) 5 MG tablet TAKE 1 TABLET(5 MG) BY MOUTH DAILY AS NEEDED FOR ITCHING 30 tablet 2   LORazepam (ATIVAN) 0.5 MG tablet Take 0.5 mg by mouth at bedtime.     meloxicam (MOBIC)  15 MG tablet Take 15 mg by mouth daily.     traMADol (ULTRAM) 50 MG tablet tramadol 50 mg tablet  TAKE 1 TABLET BY MOUTH THREE TIMES DAILY FOR 10 DAYS AS DIRECTED     triamcinolone cream (KENALOG) 0.1 % Apply 1 Application topically daily. 45 g 3   VOLTAREN 1 % GEL Apply 1 application topically as needed.  2   Crisaborole (EUCRISA) 2 % OINT Apply 1 Application topically 2 (two) times daily as needed (mild rash). 60 g 5   fluticasone (FLONASE) 50 MCG/ACT nasal spray Place 1-2 sprays into both nostrils daily for 7 days. 1 g 0   No current facility-administered medications for this visit.   Allergies: Allergies  Allergen Reactions   Latex Itching, Swelling and Rash   I reviewed her past medical history, social history, family history, and environmental history and no significant changes have been reported from her previous visit.  Review of Systems  Constitutional:  Negative for appetite change, chills, fever and unexpected weight change.  HENT:  Negative for congestion and rhinorrhea.   Eyes:  Negative for itching.  Respiratory:  Negative for cough, chest tightness, shortness of breath and wheezing.   Cardiovascular:  Negative for chest pain.  Gastrointestinal:  Negative for abdominal pain.  Genitourinary:  Negative for difficulty urinating.  Skin:  Positive for rash.  Allergic/Immunologic: Negative for environmental allergies and food allergies.  Neurological:  Negative for headaches.    Objective: BP 132/86   Pulse 86   Temp 98.3 F (36.8 C)   Resp 18   Ht 5' 7.72" (1.72 m)   Wt 172 lb 8 oz (78.2 kg)   SpO2 100%   BMI 26.45 kg/m  Body mass index is 26.45 kg/m. Physical Exam Vitals and nursing note reviewed.  Constitutional:      Appearance: Normal appearance. She is well-developed.  HENT:     Head: Normocephalic and atraumatic.     Right Ear: Tympanic membrane and external ear normal.     Left Ear: Tympanic membrane and external ear normal.     Nose: Nose normal.      Mouth/Throat:     Mouth: Mucous membranes are moist.     Pharynx: Oropharynx is clear.  Eyes:     Conjunctiva/sclera: Conjunctivae normal.  Cardiovascular:     Rate and Rhythm: Normal rate and regular rhythm.     Heart sounds: Normal heart sounds. No murmur heard.    No friction rub. No gallop.  Pulmonary:     Effort: Pulmonary effort is normal.     Breath sounds: Normal breath sounds. No wheezing, rhonchi or rales.  Musculoskeletal:     Cervical back: Neck supple.  Skin:    General: Skin is warm.     Findings: No rash.  Neurological:     Mental Status: She is alert and oriented to person, place, and time.  Psychiatric:        Behavior: Behavior normal.   Previous  notes and tests were reviewed. The plan was reviewed with the patient/family, and all questions/concerned were addressed.  It was my pleasure to see Donna Simpson today and participate in her care. Please feel free to contact me with any questions or concerns.  Sincerely,  Wyline Mood, DO Allergy & Immunology  [facility] office: [phone] [facility] office: [phone]

## 2023-02-01 ENCOUNTER — Encounter: Payer: Self-pay | Admitting: Allergy

## 2023-02-01 ENCOUNTER — Other Ambulatory Visit: Payer: Self-pay

## 2023-02-01 ENCOUNTER — Ambulatory Visit: Payer: Medicaid Other | Admitting: Allergy

## 2023-02-01 VITALS — BP 132/86 | HR 86 | Temp 98.3°F | Resp 18 | Ht 67.72 in | Wt 172.5 lb

## 2023-02-01 DIAGNOSIS — L2389 Allergic contact dermatitis due to other agents: Secondary | ICD-10-CM

## 2023-02-01 DIAGNOSIS — R21 Rash and other nonspecific skin eruption: Secondary | ICD-10-CM

## 2023-02-01 DIAGNOSIS — L819 Disorder of pigmentation, unspecified: Secondary | ICD-10-CM

## 2023-02-01 DIAGNOSIS — J310 Chronic rhinitis: Secondary | ICD-10-CM | POA: Diagnosis not present

## 2023-02-01 MED ORDER — TRIAMCINOLONE ACETONIDE 0.1 % EX CREA: 1.0000 | TOPICAL_CREAM | Freq: Every day | CUTANEOUS | 3 refills | Status: AC

## 2023-02-01 MED ORDER — EUCRISA 2 % EX OINT: 1.0000 | TOPICAL_OINTMENT | Freq: Two times a day (BID) | CUTANEOUS | 5 refills | Status: AC | PRN

## 2023-02-01 NOTE — Patient Instructions (Addendum)
Contact dermatitis Positive to Nickel, caine mix, p-Phenylenediamine, and Bacitracin. Continue to avoid.  Skin discoloration/eczema Use Eucrisa (crisaborole) 2% ointment twice a day on mild rash flares on the face and body. This is a non-steroid ointment.  If it burns, place the medication in the refrigerator.  Apply a thin layer of moisturizer and then apply the Eucrisa on top of it. Continue proper skin care.   Use triamcinolone 0.1% cream twice a day as needed for rash flares. Do not use on the face, neck, armpits or groin area. Do not use more than 3 weeks in a row.   Itching/chronic rhinitis Use over the counter antihistamines such as Zyrtec (cetirizine), Claritin (loratadine), Allegra (fexofenadine), or Xyzal (levocetirizine) daily as needed. May switch antihistamines every few months. Get over the counter generic version at [facility].  Use Flonase (fluticasone) nasal spray 1 spray per nostril twice a day as needed for nasal congestion.  Nasal saline spray (i.e., Simply Saline) or nasal saline lavage (i.e., NeilMed) is recommended as needed and prior to medicated nasal sprays.  Follow up in 6 months or sooner if needed.  Skin care recommendations  Bath time: Always use lukewarm water. AVOID very hot or cold water. Keep bathing time to 5-10 minutes. Do NOT use bubble bath. Use a mild soap and use just enough to wash the dirty areas. Do NOT scrub skin vigorously.  After bathing, pat dry your skin with a towel. Do NOT rub or scrub the skin.  Moisturizers and prescriptions:  ALWAYS apply moisturizers immediately after bathing (within 3 minutes). This helps to lock-in moisture. Use the moisturizer several times a day over the whole body. Good summer moisturizers include: Aveeno, CeraVe, Cetaphil. Good winter moisturizers include: Aquaphor, Vaseline, Cerave, Cetaphil, Eucerin, Vanicream. When using moisturizers along with medications, the moisturizer should be applied about one  hour after applying the medication to prevent diluting effect of the medication or moisturize around where you applied the medications. When not using medications, the moisturizer can be continued twice daily as maintenance.  Laundry and clothing: Avoid laundry products with added color or perfumes. Use unscented hypo-allergenic laundry products such as Tide free, Cheer free & gentle, and All free and clear.  If the skin still seems dry or sensitive, you can try double-rinsing the clothes. Avoid tight or scratchy clothing such as wool. Do not use fabric softeners or dyer sheets.

## 2023-02-01 NOTE — Assessment & Plan Note (Signed)
Past history - Mainly on upper arms. Using topical cream for the itching. Interim history - uses topical Eucrisa and triamcinolone prn with good benefit. Use Eucrisa (crisaborole) 2% ointment twice a day on mild rash flares on the face and body. This is a non-steroid ointment.  Continue proper skin care.   Use triamcinolone 0.1% cream twice a day as needed for rash flares. Do not use on the face, neck, armpits or groin area. Do not use more than 3 weeks in a row.

## 2023-02-01 NOTE — Assessment & Plan Note (Signed)
Past history - 2023 patch testing was positive to Nickel, caine mix, p-Phenylenediamine, and Bacitracin. Continue to avoid.

## 2023-02-01 NOTE — Assessment & Plan Note (Signed)
Past history - 2023 skin testing and bloodwork was negative to indoor/outdoor allergens. Interim history - some symptoms with the tree pollen.  Use over the counter antihistamines such as Zyrtec (cetirizine), Claritin (loratadine), Allegra (fexofenadine), or Xyzal (levocetirizine) daily as needed. May switch antihistamines every few months. Get over the counter generic version at [facility]. Use Flonase (fluticasone) nasal spray 1 spray per nostril twice a day as needed for nasal congestion.  Nasal saline spray (i.e., Simply Saline) or nasal saline lavage (i.e., NeilMed) is recommended as needed and prior to medicated nasal sprays.

## 2023-05-10 ENCOUNTER — Other Ambulatory Visit: Payer: Self-pay | Admitting: Orthopaedic Surgery

## 2023-05-10 DIAGNOSIS — M542 Cervicalgia: Secondary | ICD-10-CM

## 2023-05-10 DIAGNOSIS — M5412 Radiculopathy, cervical region: Secondary | ICD-10-CM

## 2023-05-10 DIAGNOSIS — M47892 Other spondylosis, cervical region: Secondary | ICD-10-CM

## 2023-06-03 ENCOUNTER — Other Ambulatory Visit: Payer: BLUE CROSS/BLUE SHIELD

## 2023-06-14 ENCOUNTER — Ambulatory Visit: Payer: BLUE CROSS/BLUE SHIELD | Admitting: Nurse Practitioner

## 2023-06-14 NOTE — Progress Notes (Deleted)
   Donna Simpson 13-Feb-1961 [id number]   History:  62 y.o. G2P2002 presents for annual exam. Postmenopausal - no HRT, no bleeding. S/P 1996 TVH. Normal pap and mammogram history. Osteopenia. HLD, HTN managed by PCP.   Gynecologic History No LMP recorded. Patient has had a hysterectomy.   Contraception/Family planning: status post hysterectomy Sexually active: Yes  Health Maintenance Last Pap: 04/03/2020. Results were: Normal Last mammogram: 12/07/2022. Results were: Normal Last colonoscopy: 2015. Results were: Normal Last Dexa: 04/26/2022. Results were: T-score -2.1, FRAX 9.6% / 2.5%  Past medical history, past surgical history, family history and social history were all reviewed and documented in the EPIC chart. Engaged. 61 yo daughter, 62 yo son. 3 granddaughters.   ROS:  A ROS was performed and pertinent positives and negatives are included.  Exam:  There were no vitals filed for this visit.   There is no height or weight on file to calculate BMI.  General appearance:  Normal Thyroid:  Symmetrical, normal in size, without palpable masses or nodularity. Respiratory  Auscultation:  Clear without wheezing or rhonchi Cardiovascular  Auscultation:  Regular rate, without rubs, murmurs or gallops  Edema/varicosities:  Not grossly evident Abdominal  Soft,nontender, without masses, guarding or rebound.  Liver/spleen:  No organomegaly noted  Hernia:  None appreciated  Skin  Inspection:  Grossly normal Breasts: Examined lying and sitting.   Right: Without masses, retractions, nipple discharge or axillary adenopathy.   Left: Without masses, retractions, nipple discharge or axillary adenopathy. Genitourinary   Inguinal/mons:  Normal without inguinal adenopathy  External genitalia:  Normal appearing vulva with no masses, tenderness, or lesions  BUS/Urethra/Skene's glands:  Normal  Vagina:  Normal appearing with normal color and discharge, no lesions  Cervix:  Absent  Uterus:   Absent  Adnexa/parametria:     Rt: Normal in size, without masses or tenderness.   Lt: Normal in size, without masses or tenderness.  Anus and perineum: Normal  Digital rectal exam: Not indicated  Patient informed chaperone available to be present for breast and pelvic exam. Patient has requested no chaperone to be present. Patient has been advised what will be completed during breast and pelvic exam.   Assessment/Plan:  62 y.o. G2P2002 for annual exam.   Well female exam with routine gynecological exam - Education provided on SBEs, importance of preventative screenings, current guidelines, high calcium diet, regular exercise, and multivitamin daily. Labs with PCP.   Postmenopausal - no HRT. S/PTVH.   Osteopenia of multiple sites - T-score -2.1 without elevated FRAX 04/26/2022. Continue daily Vitamin D supplement and regular exercise.  Works out 3-5 days per week doing cardio and weight training.   Screening for cervical cancer - Normal Pap history.  No longer screening per guidelines.   Screening for breast cancer - Normal mammogram history.  Continue annual screenings. Normal breast exam today.  Screening for colon cancer - 2015 colonoscopy. Will repeat at GI's recommended interval.   Return in 1 year for annual.      Olivia Mackie DNP, 8:56 AM 06/14/2023

## 2023-07-24 ENCOUNTER — Ambulatory Visit: Payer: BLUE CROSS/BLUE SHIELD | Admitting: Allergy

## 2023-11-14 ENCOUNTER — Other Ambulatory Visit: Payer: Self-pay | Admitting: Nurse Practitioner

## 2023-11-14 DIAGNOSIS — Z1231 Encounter for screening mammogram for malignant neoplasm of breast: Secondary | ICD-10-CM

## 2023-12-13 ENCOUNTER — Ambulatory Visit
Admission: RE | Admit: 2023-12-13 | Discharge: 2023-12-13 | Disposition: A | Discharge status: Home / Self Care | Payer: Medicaid Other | Source: Ambulatory Visit | Attending: Nurse Practitioner | Admitting: Nurse Practitioner

## 2023-12-13 DIAGNOSIS — Z1231 Encounter for screening mammogram for malignant neoplasm of breast: Secondary | ICD-10-CM

## 2024-05-21 ENCOUNTER — Encounter: Payer: Self-pay | Admitting: Nurse Practitioner

## 2024-05-21 ENCOUNTER — Ambulatory Visit (INDEPENDENT_AMBULATORY_CARE_PROVIDER_SITE_OTHER): Admitting: Nurse Practitioner

## 2024-05-21 VITALS — BP 120/80 | HR 80 | Ht 67.0 in | Wt 175.4 lb

## 2024-05-21 DIAGNOSIS — Z1331 Encounter for screening for depression: Secondary | ICD-10-CM | POA: Diagnosis not present

## 2024-05-21 DIAGNOSIS — M8589 Other specified disorders of bone density and structure, multiple sites: Secondary | ICD-10-CM

## 2024-05-21 DIAGNOSIS — Z780 Asymptomatic menopausal state: Secondary | ICD-10-CM | POA: Diagnosis not present

## 2024-05-21 DIAGNOSIS — Z01419 Encounter for gynecological examination (general) (routine) without abnormal findings: Secondary | ICD-10-CM | POA: Diagnosis not present

## 2024-05-21 NOTE — Progress Notes (Signed)
   Donna Simpson 12/11/1960 [id number]   History:  62 y.o. G2P2002 presents for annual exam. Postmenopausal - no HRT. S/P 1996 TVH. Normal pap and mammogram history. H/O osteopenia, RA. HLD, HTN managed by PCP.   Gynecologic History No LMP recorded. Patient has had a hysterectomy.   Contraception/Family planning: status post hysterectomy Sexually active: Yes  Health Maintenance Last Pap: 04/03/2020. Results were: Normal Last mammogram: 12/13/2023. Results were: Normal Last colonoscopy: 09/29/2014. Results were: Normal Last Dexa: 04/26/2022. Results were: T-score -2.1, FRAX 9.6% / 2.5%  Past medical history, past surgical history, family history and social history were all reviewed and documented in the EPIC chart. Engaged. 61 yo daughter, 67 yo son. 3 granddaughters.   ROS:  A ROS was performed and pertinent positives and negatives are included.  Exam:  Vitals:   05/21/24 1545  BP: 120/80  Pulse: 80  SpO2: 100%  Weight: 175 lb 6.4 oz (79.6 kg)  Height: 5' 7 (1.702 m)     Body mass index is 27.47 kg/m.  General appearance:  Normal Thyroid :  Symmetrical, normal in size, without palpable masses or nodularity. Respiratory  Auscultation:  Clear without wheezing or rhonchi Cardiovascular  Auscultation:  Regular rate, without rubs, murmurs or gallops  Edema/varicosities:  Not grossly evident Abdominal  Soft,nontender, without masses, guarding or rebound.  Liver/spleen:  No organomegaly noted  Hernia:  None appreciated  Skin  Inspection:  Grossly normal Breasts: Examined lying and sitting.   Right: Without masses, retractions, nipple discharge or axillary adenopathy.   Left: Without masses, retractions, nipple discharge or axillary adenopathy. Pelvic: External genitalia:  no lesions              Urethra:  normal appearing urethra with no masses, tenderness or lesions              Bartholins and Skenes: normal                 Vagina: normal appearing vagina with normal  color and discharge, no lesions              Cervix: absent Bimanual Exam:  Uterus:  absent              Adnexa: no mass, fullness, tenderness              Rectovaginal: Deferred              Anus:  normal, no lesions  Donna Mole, NP student performed exam with observation.   Assessment/Plan:  63 y.o. [id number] for annual exam.   Well female exam with routine gynecological exam - Education provided on SBEs, importance of preventative screenings, current guidelines, high calcium diet, regular exercise, and multivitamin daily. Labs with PCP.   Postmenopausal - no HRT. S/PTVH.   Osteopenia of multiple sites - Plan: DG Bone Density. T-score -2.1 without elevated FRAX 04/26/2022. Continue daily Vitamin D  supplement and regular exercise.  Works out 3-5 days per week doing cardio and weight training. Schedule DXA.   Screening for cervical cancer - Normal Pap history.  No longer screening per guidelines.   Screening for breast cancer - Normal mammogram history.  Continue annual screenings. Normal breast exam today.  Screening for colon cancer - 2015 colonoscopy. Planning to call GI to schedule colonoscopy for later this year.   Return in about 1 year (around 05/21/2025) for Annual.     Donna DELENA Shutter DNP, 4:18 PM 05/21/2024

## 2024-06-05 ENCOUNTER — Ambulatory Visit (HOSPITAL_COMMUNITY)
Admission: RE | Admit: 2024-06-05 | Discharge: 2024-06-05 | Disposition: A | Discharge status: Home / Self Care | Source: Ambulatory Visit | Attending: Nurse Practitioner | Admitting: Nurse Practitioner

## 2024-06-05 DIAGNOSIS — M8589 Other specified disorders of bone density and structure, multiple sites: Secondary | ICD-10-CM | POA: Insufficient documentation

## 2024-06-06 ENCOUNTER — Ambulatory Visit: Payer: Self-pay | Admitting: Nurse Practitioner

## 2024-09-04 ENCOUNTER — Encounter (HOSPITAL_COMMUNITY): Payer: Self-pay

## 2024-09-04 ENCOUNTER — Telehealth (HOSPITAL_COMMUNITY): Payer: Self-pay | Admitting: *Deleted

## 2024-09-04 ENCOUNTER — Ambulatory Visit (HOSPITAL_COMMUNITY): Admission: EM | Admit: 2024-09-04 | Discharge: 2024-09-04 | Disposition: A | Discharge status: Home / Self Care

## 2024-09-04 DIAGNOSIS — J209 Acute bronchitis, unspecified: Secondary | ICD-10-CM | POA: Diagnosis not present

## 2024-09-04 MED ORDER — GUAIFENESIN ER 600 MG PO TB12: 600.0000 mg | ORAL_TABLET | Freq: Two times a day (BID) | ORAL | 0 refills | Status: AC

## 2024-09-04 MED ORDER — BENZONATATE 100 MG PO CAPS: 100.0000 mg | ORAL_CAPSULE | Freq: Three times a day (TID) | ORAL | 0 refills | Status: AC

## 2024-09-04 MED ORDER — PREDNISONE 50 MG PO TABS: ORAL_TABLET | ORAL | 0 refills | Status: AC

## 2024-09-04 MED ORDER — ALBUTEROL SULFATE HFA 108 (90 BASE) MCG/ACT IN AERS: 2.0000 | INHALATION_SPRAY | RESPIRATORY_TRACT | 0 refills | Status: AC | PRN

## 2024-09-04 NOTE — Telephone Encounter (Signed)
 Pt states insurance will not cover benzonatate  she would like to know if you can send in a liquid cough med instead for her since it would be better for her cough

## 2024-09-04 NOTE — ED Triage Notes (Signed)
 Patient presents to the office for cough ana nasal congestion x 1 week. Patient is coughing up yellow mucous.

## 2024-09-04 NOTE — Telephone Encounter (Signed)
 if insurance doesn't cover Tessalon  then it will not cover any prescribed cough med. She should get Delsym OTC , per [facility] End[facility]. Pt advised and verbalized understanding

## 2024-09-04 NOTE — ED Provider Notes (Signed)
 [facility]    CSN: [id number] Arrival date & time: 09/04/24  1322      History   Chief Complaint Chief Complaint  Patient presents with   Cough   Nasal Congestion    HPI Donna Simpson is a 63 y.o. female.   Patient presents today due to 1 week worth of cough productive of yellow-brownish sputum.  Patient has a history of tobacco use but states that she has been unable to smoke cigarettes since she has been sick.  Patient states he has been taking over-the-counter cold medications for her symptoms with +/- relief.  Patient states that the cough is waking her up at night occasionally.  Patient denies fever, chills, nausea, vomiting, or change in appetite.  The history is provided by the patient.  Cough   Past Medical History:  Diagnosis Date   Anxiety    Anxiety    Atypical chest pain 02/03/2017   Essential hypertension 02/03/2017   Hyperlipidemia 02/03/2017   Hypertension    Kidney stone    Kidney stones    Leg cramping 02/03/2017   Tobacco abuse 03/10/2017    Patient Active Problem List   Diagnosis Date Noted   Rash/hypopigmentation 10/03/2022   Chronic rhinitis 10/03/2022   Allergic contact dermatitis due to other agents 06/27/2022   Body mass index (BMI) 30.0-30.9, adult 05/25/2022   Thyroid  nodule 05/25/2022   Allergic reaction to substance 03/30/2022   Tobacco dependence syndrome 09/17/2021   Acute stress disorder 09/08/2021   Insomnia 09/08/2021   Low back pain 09/08/2021   Vitamin B12 deficiency (non anemic) 09/08/2021   Hyperlipoproteinemia 04/29/2021   Hypercholesterolemia 04/29/2021   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   Onychomycosis 04/21/2021   Seasonal allergic rhinitis 04/21/2021   Vitamin D  deficiency 04/21/2021   Family history of cardiomyopathy 06/05/2019   Tobacco abuse 03/10/2017   Essential hypertension 02/03/2017   Leg cramping 02/03/2017   Atypical chest pain 02/03/2017    Past Surgical  History:  Procedure Laterality Date   BACK SURGERY     Spinal fusion   BREAST SURGERY     Biopsy--benign   FOOT SURGERY     PELVIC LAPAROSCOPY     SPINAL FUSION     VAGINAL HYSTERECTOMY      OB History     Gravida  2   Para  2   Term  2   Preterm      AB      Living  2      SAB      IAB      Ectopic      Multiple      Live Births               Home Medications    Prior to Admission medications   Medication Sig Start Date End Date Taking? Authorizing Provider  albuterol  (VENTOLIN  HFA) 108 (90 Base) MCG/ACT inhaler Inhale 2 puffs into the lungs every 4 (four) hours as needed for wheezing or shortness of breath. 09/04/24  Yes Andra Krabbe C, PA-C  benzonatate  (TESSALON ) 100 MG capsule Take 1 capsule (100 mg total) by mouth every 8 (eight) hours. 09/04/24  Yes Andra Krabbe BROCKS, PA-C  guaiFENesin  (MUCINEX ) 600 MG 12 hr tablet Take 1 tablet (600 mg total) by mouth 2 (two) times daily for 10 days. 09/04/24 09/14/24 Yes Andra Krabbe BROCKS, PA-C  hydrochlorothiazide (MICROZIDE) 12.5 MG capsule Take 12.5 mg by mouth daily.   Yes  [provider]  predniSONE  (DELTASONE ) 50 MG tablet 1 tab po daily for 5 days 09/04/24  Yes Andra Corean BROCKS, PA-C  Calcium Carbonate-Vitamin D  (CALCIUM + D PO) Take by mouth.    [provider]  Crisaborole  (EUCRISA ) 2 % OINT Apply 1 Application topically 2 (two) times daily as needed (mild rash). 02/01/23   Luke Orlan HERO, DO  cyanocobalamin (,VITAMIN B-12,) 1000 MCG/ML injection Inject 1,000 mcg into the skin every 14 (fourteen) days. 04/04/22   [provider]  ergocalciferol  (VITAMIN D2) 1.25 MG (50000 UT) capsule 1 capsule    [provider]  fluticasone  (FLONASE ) 50 MCG/ACT nasal spray Place 1-2 sprays into both nostrils daily for 7 days. Patient not taking: Reported on 05/21/2024 02/20/20 02/27/20  Wieters, Hallie C, PA-C  LORazepam (ATIVAN) 0.5 MG tablet Take 0.5 mg by mouth at bedtime.  01/15/23   [provider]  meloxicam (MOBIC) 15 MG tablet Take 15 mg by mouth daily. 01/02/23   [provider]  traMADol  (ULTRAM ) 50 MG tablet tramadol  50 mg tablet  TAKE 1 TABLET BY MOUTH THREE TIMES DAILY FOR 10 DAYS AS DIRECTED    [provider]  triamcinolone  cream (KENALOG ) 0.1 % Apply 1 Application topically daily. 02/01/23   Luke Orlan HERO, DO    Family History Family History  Problem Relation Age of Onset   Cancer Mother        Lung   Hypertension Father    Heart attack Father    Heart failure Father    Hypertension Sister    Hypertension Brother    Diabetes Brother    Congenital heart disease Brother    Valvular heart disease Brother    Heart failure Brother     Social History Social History   Tobacco Use   Smoking status: Every Day    Current packs/day: 0.50    Types: Cigarettes   Smokeless tobacco: Never  Vaping Use   Vaping status: Never Used  Substance Use Topics   Alcohol use: Yes    Alcohol/week: 0.0 standard drinks of alcohol    Comment: Rare   Drug use: No     Allergies   Shellfish allergy  and Latex   Review of Systems Review of Systems  Respiratory:  Positive for cough.      Physical Exam Triage Vital Signs ED Triage Vitals  Encounter Vitals Group     BP 09/04/24 1458 (!) 177/72     Girls Systolic BP Percentile --      Girls Diastolic BP Percentile --      Boys Systolic BP Percentile --      Boys Diastolic BP Percentile --      Pulse Rate 09/04/24 1458 62     Resp 09/04/24 1458 20     Temp 09/04/24 1458 98 F (36.7 C)     Temp Source 09/04/24 1458 Oral     SpO2 09/04/24 1458 98 %     Weight --      Height --      Head Circumference --      Peak Flow --      Pain Score 09/04/24 1501 0     Pain Loc --      Pain Education --      Exclude from Growth Chart --    No data found.  Updated Vital Signs BP (!) 177/72 (BP Location: Left Arm)   Pulse 62   Temp 98 F (36.7 C) (Oral)  Resp 20   SpO2 98%    Visual Acuity Right Eye Distance:   Left Eye Distance:   Bilateral Distance:    Right Eye Near:   Left Eye Near:    Bilateral Near:     Physical Exam Vitals and nursing note reviewed.  Constitutional:      General: She is not in acute distress.    Appearance: Normal appearance. She is not ill-appearing, toxic-appearing or diaphoretic.  Eyes:     General: No scleral icterus. Cardiovascular:     Rate and Rhythm: Normal rate and regular rhythm.     Heart sounds: Normal heart sounds.  Pulmonary:     Effort: Pulmonary effort is normal. No respiratory distress.     Breath sounds: Normal breath sounds. No wheezing or rhonchi.  Skin:    General: Skin is warm.  Neurological:     Mental Status: She is alert and oriented to person, place, and time.  Psychiatric:        Mood and Affect: Mood normal.        Behavior: Behavior normal.      UC Treatments / Results  Labs (all labs ordered are listed, but only abnormal results are displayed) Labs Reviewed - No data to display  EKG   Radiology No results found.  Procedures Procedures (including critical care time)  Medications Ordered in UC Medications - No data to display  Initial Impression / Assessment and Plan / UC Course  I have reviewed the triage vital signs and the nursing notes.  Pertinent labs & imaging results that were available during my care of the patient were reviewed by me and considered in my medical decision making (see chart for details).     Physical exam unremarkable, blood pressure elevated most likely due to use of OTC cold medications with decongestants.  Patient was treated for acute bronchitis with Tessalon  100 mg 3 times daily as needed for cough, Mucinex  600 mg twice daily for 10 days, prednisone  50 mg once daily for 5 days, and albuterol  2 puffs every 4 hours as needed for shortness of breath and coughing spells. Final Clinical Impressions(s) / UC Diagnoses   Final diagnoses:  Acute  bronchitis, unspecified organism   Discharge Instructions   None    ED Prescriptions     Medication Sig Dispense Auth. Provider   benzonatate  (TESSALON ) 100 MG capsule Take 1 capsule (100 mg total) by mouth every 8 (eight) hours. 21 capsule Andra Krabbe C, PA-C   guaiFENesin  (MUCINEX ) 600 MG 12 hr tablet Take 1 tablet (600 mg total) by mouth 2 (two) times daily for 10 days. 20 tablet Zaiyah Sottile C, PA-C   predniSONE  (DELTASONE ) 50 MG tablet 1 tab po daily for 5 days 5 tablet Andra Krabbe C, PA-C   albuterol  (VENTOLIN  HFA) 108 (90 Base) MCG/ACT inhaler Inhale 2 puffs into the lungs every 4 (four) hours as needed for wheezing or shortness of breath. 8 g Andra Krabbe BROCKS, PA-C      PDMP not reviewed this encounter.   Andra Krabbe BROCKS, PA-C 09/04/24 1513

## 2024-11-19 DIAGNOSIS — Z1231 Encounter for screening mammogram for malignant neoplasm of breast: Secondary | ICD-10-CM

## 2024-12-18 ENCOUNTER — Ambulatory Visit
Admission: RE | Admit: 2024-12-18 | Discharge: 2024-12-18 | Disposition: A | Source: Ambulatory Visit | Attending: Nurse Practitioner | Admitting: Nurse Practitioner

## 2024-12-18 DIAGNOSIS — Z1231 Encounter for screening mammogram for malignant neoplasm of breast: Secondary | ICD-10-CM

## 2025-05-29 ENCOUNTER — Ambulatory Visit: Admitting: Nurse Practitioner

## 2025-06-04 ENCOUNTER — Ambulatory Visit: Admitting: Nurse Practitioner
